# Patient Record
Sex: Female | Born: 1965
Health system: Southern US, Community
[De-identification: ages and names within clinical notes are randomized; demographics above are authoritative.]

## PROBLEM LIST (undated history)

## (undated) HISTORY — PX: BREAST BIOPSY: SHX20

---

## 1998-12-07 ENCOUNTER — Other Ambulatory Visit: Admission: RE | Admit: 1998-12-07 | Discharge: 1998-12-07 | Payer: Self-pay | Admitting: *Deleted

## 1999-12-17 ENCOUNTER — Other Ambulatory Visit: Admission: RE | Admit: 1999-12-17 | Discharge: 1999-12-17 | Payer: Self-pay | Admitting: *Deleted

## 2000-01-28 ENCOUNTER — Other Ambulatory Visit: Admission: RE | Admit: 2000-01-28 | Discharge: 2000-01-28 | Payer: Self-pay | Admitting: *Deleted

## 2006-03-12 ENCOUNTER — Ambulatory Visit: Payer: Self-pay | Admitting: Obstetrics and Gynecology

## 2007-05-27 ENCOUNTER — Ambulatory Visit: Payer: Self-pay | Admitting: Obstetrics and Gynecology

## 2008-07-17 ENCOUNTER — Ambulatory Visit: Payer: Self-pay | Admitting: Obstetrics and Gynecology

## 2008-07-27 ENCOUNTER — Ambulatory Visit: Payer: Self-pay | Admitting: Obstetrics and Gynecology

## 2009-09-19 ENCOUNTER — Ambulatory Visit: Payer: Self-pay | Admitting: Obstetrics and Gynecology

## 2009-10-02 ENCOUNTER — Ambulatory Visit: Payer: Self-pay

## 2010-12-26 ENCOUNTER — Ambulatory Visit: Payer: Self-pay | Admitting: Obstetrics and Gynecology

## 2012-02-25 ENCOUNTER — Ambulatory Visit: Payer: Self-pay | Admitting: Obstetrics and Gynecology

## 2013-03-02 ENCOUNTER — Ambulatory Visit: Payer: Self-pay | Admitting: Obstetrics and Gynecology

## 2014-03-29 ENCOUNTER — Ambulatory Visit: Payer: Self-pay | Admitting: Obstetrics and Gynecology

## 2015-06-01 ENCOUNTER — Other Ambulatory Visit
Admission: RE | Admit: 2015-06-01 | Discharge: 2015-06-01 | Disposition: A | Discharge status: Home / Self Care | Payer: 59 | Source: Ambulatory Visit | Attending: Obstetrics and Gynecology | Admitting: Obstetrics and Gynecology

## 2015-06-01 DIAGNOSIS — Z1329 Encounter for screening for other suspected endocrine disorder: Secondary | ICD-10-CM | POA: Insufficient documentation

## 2015-06-01 DIAGNOSIS — Z131 Encounter for screening for diabetes mellitus: Secondary | ICD-10-CM | POA: Insufficient documentation

## 2015-06-01 DIAGNOSIS — Z1321 Encounter for screening for nutritional disorder: Secondary | ICD-10-CM | POA: Diagnosis not present

## 2015-06-01 DIAGNOSIS — Z136 Encounter for screening for cardiovascular disorders: Secondary | ICD-10-CM | POA: Diagnosis present

## 2015-06-01 DIAGNOSIS — Z13220 Encounter for screening for lipoid disorders: Secondary | ICD-10-CM | POA: Diagnosis not present

## 2015-06-01 LAB — LIPID PANEL
Cholesterol: 253 mg/dL — ABNORMAL HIGH (ref 0–200)
HDL: 55 mg/dL (ref >40)
LDL Cholesterol: 175 mg/dL — ABNORMAL HIGH (ref 0–99)
Total CHOL/HDL Ratio: 4.6 RATIO
Triglycerides: 116 mg/dL (ref <150)
VLDL: 23 mg/dL (ref 0–40)

## 2015-06-01 LAB — COMPREHENSIVE METABOLIC PANEL
ALBUMIN: 3.9 g/dL (ref 3.5–5.0)
ALK PHOS: 52 U/L (ref 38–126)
ALT: 9 U/L — AB (ref 14–54)
AST: 24 U/L (ref 15–41)
Anion gap: 7 (ref 5–15)
BUN: 16 mg/dL (ref 6–20)
CALCIUM: 8.9 mg/dL (ref 8.9–10.3)
CHLORIDE: 109 mmol/L (ref 101–111)
CO2: 25 mmol/L (ref 22–32)
Creatinine, Ser: 0.87 mg/dL (ref 0.44–1.00)
GFR calc Af Amer: >60 mL/min (ref >60)
GFR calc non Af Amer: >60 mL/min (ref >60)
GLUCOSE: 99 mg/dL (ref 65–99)
Potassium: 3.6 mmol/L (ref 3.5–5.1)
SODIUM: 141 mmol/L (ref 135–145)
Total Bilirubin: 0.4 mg/dL (ref 0.3–1.2)
Total Protein: 7.7 g/dL (ref 6.5–8.1)

## 2015-06-01 LAB — CBC WITH DIFFERENTIAL/PLATELET
Basophils Absolute: 0 10*3/uL (ref 0–0.1)
Basophils Relative: 0 %
EOS ABS: 0.1 10*3/uL (ref 0–0.7)
EOS PCT: 2 %
HCT: 39 % (ref 35.0–47.0)
HEMOGLOBIN: 12.3 g/dL (ref 12.0–16.0)
Lymphocytes Relative: 36 %
Lymphs Abs: 1.6 10*3/uL (ref 1.0–3.6)
MCH: 24.4 pg — ABNORMAL LOW (ref 26.0–34.0)
MCHC: 31.6 g/dL — ABNORMAL LOW (ref 32.0–36.0)
MCV: 77 fL — ABNORMAL LOW (ref 80.0–100.0)
Monocytes Absolute: 0.4 10*3/uL (ref 0.2–0.9)
Monocytes Relative: 8 %
NEUTROS PCT: 54 %
Neutro Abs: 2.4 10*3/uL (ref 1.4–6.5)
PLATELETS: 183 10*3/uL (ref 150–440)
RBC: 5.07 MIL/uL (ref 3.80–5.20)
RDW: 19.2 % — ABNORMAL HIGH (ref 11.5–14.5)
WBC: 4.5 10*3/uL (ref 3.6–11.0)

## 2015-06-01 LAB — TSH: TSH: 1.64 u[IU]/mL (ref 0.350–4.500)

## 2015-06-02 LAB — VITAMIN D 25 HYDROXY (VIT D DEFICIENCY, FRACTURES): Vit D, 25-Hydroxy: 18.1 ng/mL — ABNORMAL LOW (ref 30.0–100.0)

## 2015-06-12 ENCOUNTER — Other Ambulatory Visit: Payer: Self-pay | Admitting: Obstetrics and Gynecology

## 2015-06-12 DIAGNOSIS — Z1231 Encounter for screening mammogram for malignant neoplasm of breast: Secondary | ICD-10-CM

## 2015-06-13 ENCOUNTER — Ambulatory Visit
Admission: RE | Admit: 2015-06-13 | Discharge: 2015-06-13 | Disposition: A | Discharge status: Home / Self Care | Payer: 59 | Source: Ambulatory Visit | Attending: Obstetrics and Gynecology | Admitting: Obstetrics and Gynecology

## 2015-06-13 DIAGNOSIS — Z1231 Encounter for screening mammogram for malignant neoplasm of breast: Secondary | ICD-10-CM

## 2015-11-27 DIAGNOSIS — D485 Neoplasm of uncertain behavior of skin: Secondary | ICD-10-CM | POA: Diagnosis not present

## 2015-12-31 ENCOUNTER — Encounter: Payer: Self-pay | Admitting: Physician Assistant

## 2015-12-31 ENCOUNTER — Ambulatory Visit: Payer: Self-pay | Admitting: Physician Assistant

## 2015-12-31 VITALS — BP 112/80 | HR 76 | Temp 98.3°F

## 2015-12-31 DIAGNOSIS — J018 Other acute sinusitis: Secondary | ICD-10-CM

## 2015-12-31 MED ORDER — AZITHROMYCIN 250 MG PO TABS: ORAL_TABLET | ORAL | Status: DC

## 2015-12-31 NOTE — Progress Notes (Signed)
S: C/o runny nose and congestion for 3 days, no fever, chills, cp/sob, v/d; mucus is yellow and thick, cough is sporadic, c/o of facial and dental pain. Some headache, thinks she might have had a fever, husband said she felt warm  Using otc meds:   O: PE: vitals wnl, nad, perrl eomi, normocephalic, tms dull, nasal mucosa red and swollen, throat injected, neck supple no lymph, lungs c t a, cv rrr, neuro intact  A:  Acute sinusitis   P: zpack, drink fluids, continue regular meds , use otc meds of choice, return if not improving in 5 days, return earlier if worsening

## 2016-01-21 ENCOUNTER — Telehealth: Payer: Self-pay | Admitting: Gastroenterology

## 2016-01-21 NOTE — Telephone Encounter (Signed)
colonoscopy

## 2016-01-22 DIAGNOSIS — D2311 Other benign neoplasm of skin of right eyelid, including canthus: Secondary | ICD-10-CM | POA: Diagnosis not present

## 2016-01-22 DIAGNOSIS — D485 Neoplasm of uncertain behavior of skin: Secondary | ICD-10-CM | POA: Diagnosis not present

## 2016-02-15 ENCOUNTER — Other Ambulatory Visit: Payer: Self-pay

## 2016-02-15 ENCOUNTER — Telehealth: Payer: Self-pay

## 2016-02-15 NOTE — Telephone Encounter (Signed)
LVM for pt to return my call.

## 2016-02-15 NOTE — Telephone Encounter (Signed)
Gastroenterology Pre-Procedure Review  Request Date: 02/29/16 Requesting Physician: Dr. Georga Bora  PATIENT REVIEW QUESTIONS: The patient responded to the following health history questions as indicated:    1. Are you having any GI issues? no 2. Do you have a personal history of Polyps? no 3. Do you have a family history of Colon Cancer or Polyps? no 4. Diabetes Mellitus? no 5. Joint replacements in the past 12 months?no 6. Major health problems in the past 3 months?no 7. Any artificial heart valves, MVP, or defibrillator?no    MEDICATIONS & ALLERGIES:    Patient reports the following regarding taking any anticoagulation/antiplatelet therapy:   Plavix, Coumadin, Eliquis, Xarelto, Lovenox, Pradaxa, Brilinta, or Effient? no Aspirin? no  Patient confirms/reports the following medications:  Current Outpatient Prescriptions  Medication Sig Dispense Refill  . azithromycin (ZITHROMAX) 250 MG tablet 2 pills today then 1 pill a day for 4 days 6 tablet 0   No current facility-administered medications for this visit.    Patient confirms/reports the following allergies:  Not on File  No orders of the defined types were placed in this encounter.    AUTHORIZATION INFORMATION Primary Insurance: 1D#: Group #:  Secondary Insurance: 1D#: Group #:  SCHEDULE INFORMATION: Date: 02/29/16 Time: Location: [place]

## 2016-02-15 NOTE — Telephone Encounter (Signed)
Pt scheduled for screening colonoscopy at [facility] on 02/29/16. Instructs/rx mailed. Please precert.

## 2016-02-27 NOTE — Discharge Instructions (Signed)

## 2016-02-29 ENCOUNTER — Encounter
Admission: RE | Disposition: A | Discharge status: Home / Self Care | Payer: Self-pay | Source: Ambulatory Visit | Attending: Gastroenterology

## 2016-02-29 ENCOUNTER — Ambulatory Visit: Payer: 59 | Admitting: Anesthesiology

## 2016-02-29 ENCOUNTER — Ambulatory Visit
Admission: RE | Admit: 2016-02-29 | Discharge: 2016-02-29 | Disposition: A | Discharge status: Home / Self Care | Payer: 59 | Source: Ambulatory Visit | Attending: Gastroenterology | Admitting: Gastroenterology

## 2016-02-29 DIAGNOSIS — Z0000 Encounter for general adult medical examination without abnormal findings: Secondary | ICD-10-CM | POA: Insufficient documentation

## 2016-02-29 DIAGNOSIS — Z9889 Other specified postprocedural states: Secondary | ICD-10-CM | POA: Insufficient documentation

## 2016-02-29 DIAGNOSIS — Z1211 Encounter for screening for malignant neoplasm of colon: Secondary | ICD-10-CM | POA: Insufficient documentation

## 2016-02-29 DIAGNOSIS — K640 First degree hemorrhoids: Secondary | ICD-10-CM | POA: Diagnosis not present

## 2016-02-29 HISTORY — PX: COLONOSCOPY WITH PROPOFOL: SHX5780

## 2016-02-29 SURGERY — COLONOSCOPY WITH PROPOFOL
Anesthesia: Monitor Anesthesia Care | Wound class: Contaminated

## 2016-02-29 MED ORDER — STERILE WATER FOR IRRIGATION IR SOLN
Status: DC | PRN
  Administered 2016-02-29: 10:00:00

## 2016-02-29 MED ORDER — LACTATED RINGERS IV SOLN
INTRAVENOUS | Status: DC
  Administered 2016-02-29: 09:00:00 via INTRAVENOUS

## 2016-02-29 MED ORDER — PROPOFOL 10 MG/ML IV BOLUS
INTRAVENOUS | Status: DC | PRN
Start: 2016-02-29 — End: 2016-02-29
  Administered 2016-02-29 (×3): 20 mg via INTRAVENOUS
  Administered 2016-02-29: 100 mg via INTRAVENOUS
  Administered 2016-02-29: 20 mg via INTRAVENOUS
  Administered 2016-02-29: 50 mg via INTRAVENOUS
  Administered 2016-02-29: 20 mg via INTRAVENOUS
  Administered 2016-02-29: 50 mg via INTRAVENOUS

## 2016-02-29 MED ORDER — LIDOCAINE HCL (CARDIAC) 20 MG/ML IV SOLN
INTRAVENOUS | Status: DC | PRN
  Administered 2016-02-29: 40 mg via INTRAVENOUS

## 2016-02-29 SURGICAL SUPPLY — 22 items
CANISTER SUCT 1200ML W/VALVE (MISCELLANEOUS) ×3 IMPLANT
CLIP HMST 235XBRD CATH ROT (MISCELLANEOUS) IMPLANT
CLIP RESOLUTION 360 11X235 (MISCELLANEOUS)
FCP ESCP3.2XJMB 240X2.8X (MISCELLANEOUS)
FORCEPS BIOP RAD 4 LRG CAP 4 (CUTTING FORCEPS) IMPLANT
FORCEPS BIOP RJ4 240 W/NDL (MISCELLANEOUS)
FORCEPS ESCP3.2XJMB 240X2.8X (MISCELLANEOUS) IMPLANT
GOWN CVR UNV OPN BCK APRN NK (MISCELLANEOUS) ×2 IMPLANT
GOWN ISOL THUMB LOOP REG UNIV (MISCELLANEOUS) ×4
INJECTOR VARIJECT VIN23 (MISCELLANEOUS) IMPLANT
KIT DEFENDO VALVE AND CONN (KITS) IMPLANT
KIT ENDO PROCEDURE OLY (KITS) ×3 IMPLANT
MARKER SPOT ENDO TATTOO 5ML (MISCELLANEOUS) IMPLANT
PAD GROUND ADULT SPLIT (MISCELLANEOUS) IMPLANT
PROBE APC STR FIRE (PROBE) IMPLANT
SNARE SHORT THROW 13M SML OVAL (MISCELLANEOUS) IMPLANT
SNARE SHORT THROW 30M LRG OVAL (MISCELLANEOUS) IMPLANT
SNARE SNG USE RND 15MM (INSTRUMENTS) IMPLANT
SPOT EX ENDOSCOPIC TATTOO (MISCELLANEOUS)
TRAP ETRAP POLY (MISCELLANEOUS) IMPLANT
VARIJECT INJECTOR VIN23 (MISCELLANEOUS)
WATER STERILE IRR 250ML POUR (IV SOLUTION) ×3 IMPLANT

## 2016-02-29 NOTE — H&P (Signed)
  [facility]  [address] [place], [place] [address] Phone: [phone] Fax : [phone]  Primary Care Physician:  Pcp Not In System Primary Gastroenterologist:  Dr. Allen Norris  Pre-Procedure History & Physical: HPI:  Michele Hood is a 50 y.o. female is here for a screening colonoscopy.   History reviewed. No pertinent past medical history.  Past Surgical History  Procedure Laterality Date  . Breast biopsy Left 1990's    cyst removed    Prior to Admission medications   Medication Sig Start Date End Date Taking? Authorizing Provider  azithromycin (ZITHROMAX) 250 MG tablet 2 pills today then 1 pill a day for 4 days Patient not taking: Reported on 02/19/2016 12/31/15   Versie Starks, PA-C    Allergies as of 02/15/2016  . (No Known Allergies)    History reviewed. No pertinent family history.  Social History   Social History  . Marital Status: Married    Spouse Name: N/A  . Number of Children: N/A  . Years of Education: N/A   Occupational History  . Not on file.   Social History Main Topics  . Smoking status: Never Smoker   . Smokeless tobacco: Not on file  . Alcohol Use: No  . Drug Use: Not on file  . Sexual Activity: Not on file   Other Topics Concern  . Not on file   Social History Narrative    Review of Systems: See HPI, otherwise negative ROS  Physical Exam: BP 138/91 mmHg  Pulse 82  Temp(Src) 98.1 F (36.7 C) (Temporal)  Ht 5' 3.5" (1.613 m)  Wt 215 lb (97.523 kg)  BMI 37.48 kg/m2  SpO2 100%  LMP  General:   Alert,  pleasant and cooperative in NAD Head:  Normocephalic and atraumatic. Neck:  Supple; no masses or thyromegaly. Lungs:  Clear throughout to auscultation.    Heart:  Regular rate and rhythm. Abdomen:  Soft, nontender and nondistended. Normal bowel sounds, without guarding, and without rebound.   Neurologic:  Alert and  oriented x4;  grossly normal neurologically.  Impression/Plan: Michele Hood is now  here to undergo a screening colonoscopy.  Risks, benefits, and alternatives regarding colonoscopy have been reviewed with the patient.  Questions have been answered.  All parties agreeable.

## 2016-02-29 NOTE — Anesthesia Preprocedure Evaluation (Signed)
Anesthesia Evaluation  Patient identified by MRN, date of birth, ID band Patient awake    Airway Mallampati: I  TM Distance: >3 FB Neck ROM: Full    Dental   Pulmonary    Pulmonary exam normal        Cardiovascular Normal cardiovascular exam     Neuro/Psych    GI/Hepatic   Endo/Other    Renal/GU      Musculoskeletal   Abdominal   Peds  Hematology   Anesthesia Other Findings   Reproductive/Obstetrics                             Anesthesia Physical Anesthesia Plan  ASA: II  Anesthesia Plan: MAC   Post-op Pain Management:    Induction: Intravenous  Airway Management Planned:   Additional Equipment:   Intra-op Plan:   Post-operative Plan:   Informed Consent: I have reviewed the patients History and Physical, chart, labs and discussed the procedure including the risks, benefits and alternatives for the proposed anesthesia with the patient or authorized representative who has indicated his/her understanding and acceptance.     Plan Discussed with: CRNA  Anesthesia Plan Comments:         Anesthesia Quick Evaluation

## 2016-02-29 NOTE — Op Note (Signed)
[facility]astroenterology Patient Name: Michele Hood Procedure Date: 02/29/2016 10:00 AM MRN: [id number] Account #: [id number] Date of Birth: September 30, 1965 Admit Type: Outpatient Age: 50 Room: [facility] OR ROOM 01 Gender: Female Note Status: Finalized Procedure:            Colonoscopy Indications:          Screening for colorectal malignant neoplasm Providers:            Lucilla Lame, MD Referring MD:         Shelby Mattocks. Georga Bora, MD (Referring MD) Medicines:            Propofol per Anesthesia Complications:        No immediate complications. Procedure:            Pre-Anesthesia Assessment:                       - Prior to the procedure, a History and Physical was                        performed, and patient medications and allergies were                        reviewed. The patient's tolerance of previous                        anesthesia was also reviewed. The risks and benefits of                        the procedure and the sedation options and risks were                        discussed with the patient. All questions were                        answered, and informed consent was obtained. Prior                        Anticoagulants: The patient has taken no previous                        anticoagulant or antiplatelet agents. ASA Grade                        Assessment: II - A patient with mild systemic disease.                        After reviewing the risks and benefits, the patient was                        deemed in satisfactory condition to undergo the                        procedure.                       After obtaining informed consent, the colonoscope was                        passed under direct vision. Throughout the procedure,  the patient's blood pressure, pulse, and oxygen                        saturations were monitored continuously. The Olympus CF                        H180AL colonoscope (S#: [id number]) was introduced  through                        the anus and advanced to the the cecum, identified by                        appendiceal orifice and ileocecal valve. The                        colonoscopy was performed without difficulty. The                        patient tolerated the procedure well. The quality of                        the bowel preparation was excellent. Findings:      The perianal and digital rectal examinations were normal.      Non-bleeding internal hemorrhoids were found during retroflexion. The       hemorrhoids were Grade I (internal hemorrhoids that do not prolapse). Impression:           - Non-bleeding internal hemorrhoids.                       - No specimens collected. Recommendation:       - Repeat colonoscopy in 10 years for screening unless                        any change in family history or lower GI problems. Procedure Code(s):    --- Professional ---                       [phone], Colonoscopy, flexible; diagnostic, including                        collection of specimen(s) by brushing or washing, when                        performed (separate procedure) Diagnosis Code(s):    --- Professional ---                       Z12.11, Encounter for screening for malignant neoplasm                        of colon CPT copyright 2016 American Medical Association. All rights reserved. The codes documented in this report are preliminary and upon coder review may  be revised to meet current compliance requirements. Lucilla Lame, MD 02/29/2016 10:22:04 AM This report has been signed electronically. Number of Addenda: 0 Note Initiated On: 02/29/2016 10:00 AM Scope Withdrawal Time: 0 hours 6 minutes 45 seconds  Total Procedure Duration: 0 hours 11 minutes 39 seconds       [facility]

## 2016-02-29 NOTE — Transfer of Care (Signed)
Immediate Anesthesia Transfer of Care Note  Patient: Michele Hood  Procedure(s) Performed: Procedure(s): COLONOSCOPY WITH PROPOFOL (N/A)  Patient Location: PACU  Anesthesia Type: MAC  Level of Consciousness: awake, alert  and patient cooperative  Airway and Oxygen Therapy: Patient Spontanous Breathing and Patient connected to supplemental oxygen  Post-op Assessment: Post-op Vital signs reviewed, Patient's Cardiovascular Status Stable, Respiratory Function Stable, Patent Airway and No signs of Nausea or vomiting  Post-op Vital Signs: Reviewed and stable  Complications: No apparent anesthesia complications

## 2016-02-29 NOTE — Anesthesia Postprocedure Evaluation (Signed)
Anesthesia Post Note  Patient: Michele Hood  Procedure(s) Performed: Procedure(s) (LRB): COLONOSCOPY WITH PROPOFOL (N/A)  Patient location during evaluation: PACU Anesthesia Type: General Level of consciousness: awake and alert Pain management: pain level controlled Vital Signs Assessment: post-procedure vital signs reviewed and stable Respiratory status: spontaneous breathing, nonlabored ventilation, respiratory function stable and patient connected to nasal cannula oxygen Cardiovascular status: blood pressure returned to baseline and stable Postop Assessment: no signs of nausea or vomiting Anesthetic complications: no    Marshell Levan

## 2016-02-29 NOTE — Anesthesia Procedure Notes (Signed)
Procedure Name: MAC Performed by: Juwon Scripter Pre-anesthesia Checklist: Patient identified, Emergency Drugs available, Suction available, Patient being monitored and Timeout performed Patient Re-evaluated:Patient Re-evaluated prior to inductionOxygen Delivery Method: Nasal cannula       

## 2016-03-03 ENCOUNTER — Encounter: Payer: Self-pay | Admitting: Gastroenterology

## 2016-05-13 ENCOUNTER — Other Ambulatory Visit: Payer: Self-pay | Admitting: Physician Assistant

## 2016-05-13 ENCOUNTER — Ambulatory Visit
Admission: RE | Admit: 2016-05-13 | Discharge: 2016-05-13 | Disposition: A | Discharge status: Home / Self Care | Payer: 59 | Source: Ambulatory Visit | Attending: Physician Assistant | Admitting: Physician Assistant

## 2016-05-13 ENCOUNTER — Ambulatory Visit: Payer: Self-pay | Admitting: Physician Assistant

## 2016-05-13 ENCOUNTER — Encounter: Payer: Self-pay | Admitting: Physician Assistant

## 2016-05-13 DIAGNOSIS — M79604 Pain in right leg: Secondary | ICD-10-CM

## 2016-05-13 NOTE — Progress Notes (Addendum)
Spoke with Megan @ Ultrasound 1610 P.M  Negative for DVT patient was released. Patient was notified of result

## 2016-05-13 NOTE — Progress Notes (Signed)
Contacted [employer] scheduling spoke with [employer].. Appt scheduled for today(NOW) @ [place]. Patient was notified.

## 2016-05-13 NOTE — Progress Notes (Signed)
S: c/o r lower leg pain, 1 week ago drove to [place], was longer than 8 hours each way, then this weekend drove her child back to school and the drive was longer than 6 hours, didn't stop a lot, tried to just make it without stopping, now has pain in r lower leg, ?if pulled muscle or dvt, area of pain is located in calf, denies cp/sob  O: nad, r lower leg tender in calf muscle, no redness or swelling, n/v intact  A: acute lower leg pain  P: due to risk factors, ordered US lower leg stat

## 2016-07-23 ENCOUNTER — Other Ambulatory Visit: Payer: Self-pay | Admitting: Obstetrics and Gynecology

## 2016-07-23 DIAGNOSIS — Z1231 Encounter for screening mammogram for malignant neoplasm of breast: Secondary | ICD-10-CM

## 2016-08-06 ENCOUNTER — Ambulatory Visit
Admission: RE | Admit: 2016-08-06 | Discharge: 2016-08-06 | Disposition: A | Discharge status: Home / Self Care | Payer: 59 | Source: Ambulatory Visit | Attending: Obstetrics and Gynecology | Admitting: Obstetrics and Gynecology

## 2016-08-06 ENCOUNTER — Encounter: Payer: Self-pay | Admitting: Radiology

## 2016-08-06 DIAGNOSIS — Z1231 Encounter for screening mammogram for malignant neoplasm of breast: Secondary | ICD-10-CM | POA: Insufficient documentation

## 2016-08-07 DIAGNOSIS — Z124 Encounter for screening for malignant neoplasm of cervix: Secondary | ICD-10-CM | POA: Diagnosis not present

## 2016-08-07 DIAGNOSIS — E282 Polycystic ovarian syndrome: Secondary | ICD-10-CM | POA: Diagnosis not present

## 2016-08-07 DIAGNOSIS — Z01419 Encounter for gynecological examination (general) (routine) without abnormal findings: Secondary | ICD-10-CM | POA: Diagnosis not present

## 2016-10-22 DIAGNOSIS — H524 Presbyopia: Secondary | ICD-10-CM | POA: Diagnosis not present

## 2017-08-26 ENCOUNTER — Other Ambulatory Visit: Payer: Self-pay | Admitting: Obstetrics and Gynecology

## 2017-08-26 DIAGNOSIS — Z1231 Encounter for screening mammogram for malignant neoplasm of breast: Secondary | ICD-10-CM

## 2017-09-16 ENCOUNTER — Ambulatory Visit
Admission: RE | Admit: 2017-09-16 | Discharge: 2017-09-16 | Disposition: A | Discharge status: Home / Self Care | Payer: 59 | Source: Ambulatory Visit | Attending: Obstetrics and Gynecology | Admitting: Obstetrics and Gynecology

## 2017-09-16 ENCOUNTER — Encounter: Payer: Self-pay | Admitting: Radiology

## 2017-09-16 DIAGNOSIS — Z01419 Encounter for gynecological examination (general) (routine) without abnormal findings: Secondary | ICD-10-CM | POA: Diagnosis not present

## 2017-09-16 DIAGNOSIS — Z1231 Encounter for screening mammogram for malignant neoplasm of breast: Secondary | ICD-10-CM | POA: Insufficient documentation

## 2017-09-16 DIAGNOSIS — Z113 Encounter for screening for infections with a predominantly sexual mode of transmission: Secondary | ICD-10-CM | POA: Diagnosis not present

## 2017-09-16 DIAGNOSIS — Z124 Encounter for screening for malignant neoplasm of cervix: Secondary | ICD-10-CM | POA: Diagnosis not present

## 2017-09-17 ENCOUNTER — Other Ambulatory Visit: Payer: Self-pay

## 2017-09-17 ENCOUNTER — Other Ambulatory Visit: Payer: Self-pay | Admitting: Obstetrics and Gynecology

## 2017-09-17 DIAGNOSIS — Z13820 Encounter for screening for osteoporosis: Secondary | ICD-10-CM

## 2017-09-17 NOTE — Progress Notes (Signed)
Patient came in to have her blood pressure checked per orders from Dr. Georga Bora.

## 2018-05-26 DIAGNOSIS — H524 Presbyopia: Secondary | ICD-10-CM | POA: Diagnosis not present

## 2018-09-20 ENCOUNTER — Other Ambulatory Visit: Payer: Self-pay | Admitting: Obstetrics and Gynecology

## 2018-09-20 DIAGNOSIS — Z1231 Encounter for screening mammogram for malignant neoplasm of breast: Secondary | ICD-10-CM

## 2018-10-13 ENCOUNTER — Ambulatory Visit: Payer: Self-pay

## 2018-10-13 ENCOUNTER — Ambulatory Visit
Admission: RE | Admit: 2018-10-13 | Discharge: 2018-10-13 | Disposition: A | Discharge status: Home / Self Care | Payer: 59 | Source: Ambulatory Visit | Attending: Obstetrics and Gynecology | Admitting: Obstetrics and Gynecology

## 2018-10-13 DIAGNOSIS — Z1231 Encounter for screening mammogram for malignant neoplasm of breast: Secondary | ICD-10-CM | POA: Diagnosis not present

## 2018-11-24 DIAGNOSIS — Z124 Encounter for screening for malignant neoplasm of cervix: Secondary | ICD-10-CM | POA: Diagnosis not present

## 2018-11-24 DIAGNOSIS — Z13220 Encounter for screening for lipoid disorders: Secondary | ICD-10-CM | POA: Diagnosis not present

## 2018-11-24 DIAGNOSIS — Z131 Encounter for screening for diabetes mellitus: Secondary | ICD-10-CM | POA: Diagnosis not present

## 2018-11-24 DIAGNOSIS — Z1321 Encounter for screening for nutritional disorder: Secondary | ICD-10-CM | POA: Diagnosis not present

## 2018-11-24 DIAGNOSIS — Z1329 Encounter for screening for other suspected endocrine disorder: Secondary | ICD-10-CM | POA: Diagnosis not present

## 2018-11-24 DIAGNOSIS — Z136 Encounter for screening for cardiovascular disorders: Secondary | ICD-10-CM | POA: Diagnosis not present

## 2018-11-24 DIAGNOSIS — Z01419 Encounter for gynecological examination (general) (routine) without abnormal findings: Secondary | ICD-10-CM | POA: Diagnosis not present

## 2018-11-24 DIAGNOSIS — Z113 Encounter for screening for infections with a predominantly sexual mode of transmission: Secondary | ICD-10-CM | POA: Diagnosis not present

## 2018-11-30 DIAGNOSIS — Z113 Encounter for screening for infections with a predominantly sexual mode of transmission: Secondary | ICD-10-CM | POA: Diagnosis not present

## 2018-12-06 ENCOUNTER — Other Ambulatory Visit: Payer: Self-pay | Admitting: Obstetrics and Gynecology

## 2018-12-06 DIAGNOSIS — Z13820 Encounter for screening for osteoporosis: Secondary | ICD-10-CM

## 2019-01-04 DIAGNOSIS — R070 Pain in throat: Secondary | ICD-10-CM | POA: Diagnosis not present

## 2019-12-07 ENCOUNTER — Other Ambulatory Visit: Payer: Self-pay | Admitting: Obstetrics and Gynecology

## 2019-12-07 DIAGNOSIS — Z1231 Encounter for screening mammogram for malignant neoplasm of breast: Secondary | ICD-10-CM

## 2019-12-20 ENCOUNTER — Ambulatory Visit
Admission: RE | Admit: 2019-12-20 | Discharge: 2019-12-20 | Disposition: A | Discharge status: Home / Self Care | Payer: 59 | Source: Ambulatory Visit | Attending: Obstetrics and Gynecology | Admitting: Obstetrics and Gynecology

## 2019-12-20 ENCOUNTER — Other Ambulatory Visit: Payer: Self-pay

## 2019-12-20 DIAGNOSIS — Z1231 Encounter for screening mammogram for malignant neoplasm of breast: Secondary | ICD-10-CM | POA: Diagnosis not present

## 2019-12-29 DIAGNOSIS — Z01419 Encounter for gynecological examination (general) (routine) without abnormal findings: Secondary | ICD-10-CM | POA: Diagnosis not present

## 2020-01-12 DIAGNOSIS — Z13220 Encounter for screening for lipoid disorders: Secondary | ICD-10-CM | POA: Diagnosis not present

## 2020-01-12 DIAGNOSIS — Z1321 Encounter for screening for nutritional disorder: Secondary | ICD-10-CM | POA: Diagnosis not present

## 2020-01-12 DIAGNOSIS — Z131 Encounter for screening for diabetes mellitus: Secondary | ICD-10-CM | POA: Diagnosis not present

## 2020-01-12 DIAGNOSIS — Z1329 Encounter for screening for other suspected endocrine disorder: Secondary | ICD-10-CM | POA: Diagnosis not present

## 2020-01-12 DIAGNOSIS — Z136 Encounter for screening for cardiovascular disorders: Secondary | ICD-10-CM | POA: Diagnosis not present

## 2020-04-26 DIAGNOSIS — H524 Presbyopia: Secondary | ICD-10-CM | POA: Diagnosis not present

## 2020-08-08 ENCOUNTER — Other Ambulatory Visit: Payer: Self-pay | Admitting: Internal Medicine

## 2020-09-07 ENCOUNTER — Ambulatory Visit: Payer: 59

## 2020-09-18 ENCOUNTER — Other Ambulatory Visit: Payer: Self-pay | Admitting: Dermatology

## 2020-09-18 DIAGNOSIS — L818 Other specified disorders of pigmentation: Secondary | ICD-10-CM | POA: Diagnosis not present

## 2020-09-18 DIAGNOSIS — L821 Other seborrheic keratosis: Secondary | ICD-10-CM | POA: Diagnosis not present

## 2020-09-24 DIAGNOSIS — Z1152 Encounter for screening for COVID-19: Secondary | ICD-10-CM | POA: Diagnosis not present

## 2020-10-23 ENCOUNTER — Ambulatory Visit: Payer: 59

## 2020-11-28 ENCOUNTER — Other Ambulatory Visit: Payer: Self-pay | Admitting: Obstetrics and Gynecology

## 2020-11-28 DIAGNOSIS — Z1231 Encounter for screening mammogram for malignant neoplasm of breast: Secondary | ICD-10-CM

## 2020-12-26 ENCOUNTER — Ambulatory Visit
Admission: RE | Admit: 2020-12-26 | Discharge: 2020-12-26 | Disposition: A | Discharge status: Home / Self Care | Payer: 59 | Source: Ambulatory Visit | Attending: Obstetrics and Gynecology | Admitting: Obstetrics and Gynecology

## 2020-12-26 ENCOUNTER — Other Ambulatory Visit: Payer: Self-pay

## 2020-12-26 DIAGNOSIS — Z1231 Encounter for screening mammogram for malignant neoplasm of breast: Secondary | ICD-10-CM | POA: Diagnosis not present

## 2020-12-31 ENCOUNTER — Other Ambulatory Visit: Payer: Self-pay

## 2021-01-01 DIAGNOSIS — Z01419 Encounter for gynecological examination (general) (routine) without abnormal findings: Secondary | ICD-10-CM | POA: Diagnosis not present

## 2021-01-01 DIAGNOSIS — Z6835 Body mass index (BMI) 35.0-35.9, adult: Secondary | ICD-10-CM | POA: Diagnosis not present

## 2021-01-01 DIAGNOSIS — F4321 Adjustment disorder with depressed mood: Secondary | ICD-10-CM | POA: Diagnosis not present

## 2021-01-01 DIAGNOSIS — E669 Obesity, unspecified: Secondary | ICD-10-CM | POA: Diagnosis not present

## 2021-01-11 DIAGNOSIS — Z13228 Encounter for screening for other metabolic disorders: Secondary | ICD-10-CM | POA: Diagnosis not present

## 2021-01-11 DIAGNOSIS — Z131 Encounter for screening for diabetes mellitus: Secondary | ICD-10-CM | POA: Diagnosis not present

## 2021-01-11 DIAGNOSIS — Z13220 Encounter for screening for lipoid disorders: Secondary | ICD-10-CM | POA: Diagnosis not present

## 2021-01-11 DIAGNOSIS — E559 Vitamin D deficiency, unspecified: Secondary | ICD-10-CM | POA: Diagnosis not present

## 2021-01-16 ENCOUNTER — Other Ambulatory Visit: Payer: Self-pay

## 2021-01-16 MED ORDER — VITAMIN D (ERGOCALCIFEROL) 1.25 MG (50000 UNIT) PO CAPS
ORAL_CAPSULE | ORAL | 0 refills | Status: DC
  Filled 2021-01-16 – 2021-02-06 (×2): qty 12, 84d supply, fill #0
  Filled 2021-04-17: qty 9, 63d supply, fill #1

## 2021-01-29 ENCOUNTER — Other Ambulatory Visit: Payer: Self-pay

## 2021-02-06 ENCOUNTER — Other Ambulatory Visit: Payer: Self-pay

## 2021-04-17 ENCOUNTER — Other Ambulatory Visit: Payer: Self-pay

## 2021-04-23 ENCOUNTER — Other Ambulatory Visit: Payer: Self-pay

## 2021-04-23 ENCOUNTER — Ambulatory Visit: Payer: Self-pay | Attending: Internal Medicine

## 2021-04-23 DIAGNOSIS — Z23 Encounter for immunization: Secondary | ICD-10-CM

## 2021-04-23 MED ORDER — PFIZER-BIONT COVID-19 VAC-TRIS 30 MCG/0.3ML IM SUSP
INTRAMUSCULAR | 0 refills | Status: AC
  Filled 2021-04-23: qty 0.3, 1d supply, fill #0

## 2021-04-23 NOTE — Progress Notes (Signed)
   Covid-19 Vaccination Clinic  Name:  Michele Hood    MRN: [id number] DOB: [birth year]  04/23/2021  Michele Hood was observed post Covid-19 immunization for 15 minutes without incident. She was provided with Vaccine Information Sheet and instruction to access the V-Safe system.   Michele Hood was instructed to call 911 with any severe reactions post vaccine: Difficulty breathing  Swelling of face and throat  A fast heartbeat  A bad rash all over body  Dizziness and weakness   Immunizations Administered     Name Date Dose VIS Date Route   PFIZER Comrnaty(Gray TOP) Covid-19 Vaccine 04/23/2021 11:19 AM 0.3 mL 09/06/2020 Intramuscular   Manufacturer: [employer]   Lot: [id number]   [place]: [place]ia, PharmD, MBA Clinical Acute Care Pharmacist

## 2021-07-31 ENCOUNTER — Other Ambulatory Visit: Payer: Self-pay

## 2021-08-01 ENCOUNTER — Other Ambulatory Visit: Payer: Self-pay

## 2021-08-07 ENCOUNTER — Other Ambulatory Visit: Payer: Self-pay

## 2021-08-07 MED ORDER — VITAMIN D (ERGOCALCIFEROL) 1.25 MG (50000 UNIT) PO CAPS
ORAL_CAPSULE | ORAL | 2 refills | Status: AC
  Filled 2021-08-07: qty 13, 90d supply, fill #0
  Filled 2021-11-13: qty 13, 90d supply, fill #1
  Filled 2022-03-11: qty 13, 90d supply, fill #2

## 2021-11-12 ENCOUNTER — Other Ambulatory Visit: Payer: Self-pay

## 2021-11-12 DIAGNOSIS — L818 Other specified disorders of pigmentation: Secondary | ICD-10-CM | POA: Diagnosis not present

## 2021-11-12 DIAGNOSIS — L590 Erythema ab igne [dermatitis ab igne]: Secondary | ICD-10-CM | POA: Diagnosis not present

## 2021-11-12 MED ORDER — HYDROQUINONE 4 % EX CREA
TOPICAL_CREAM | CUTANEOUS | 0 refills | Status: AC
  Filled 2021-11-12: qty 28.35, 30d supply, fill #0

## 2021-11-13 ENCOUNTER — Other Ambulatory Visit: Payer: Self-pay

## 2021-12-03 DIAGNOSIS — H524 Presbyopia: Secondary | ICD-10-CM | POA: Diagnosis not present

## 2021-12-18 IMAGING — MG DIGITAL SCREENING BILAT W/ TOMO W/ CAD
8 of 14 series · 8 of 40 positions shown · non-contrast
Comparison: Previous exam(s).

ACR Breast Density Category a: The breast tissue is almost entirely
fatty.

CLINICAL DATA: Screening.

EXAM:
DIGITAL SCREENING BILATERAL MAMMOGRAM WITH TOMO AND CAD

[R MLO synth-2D]
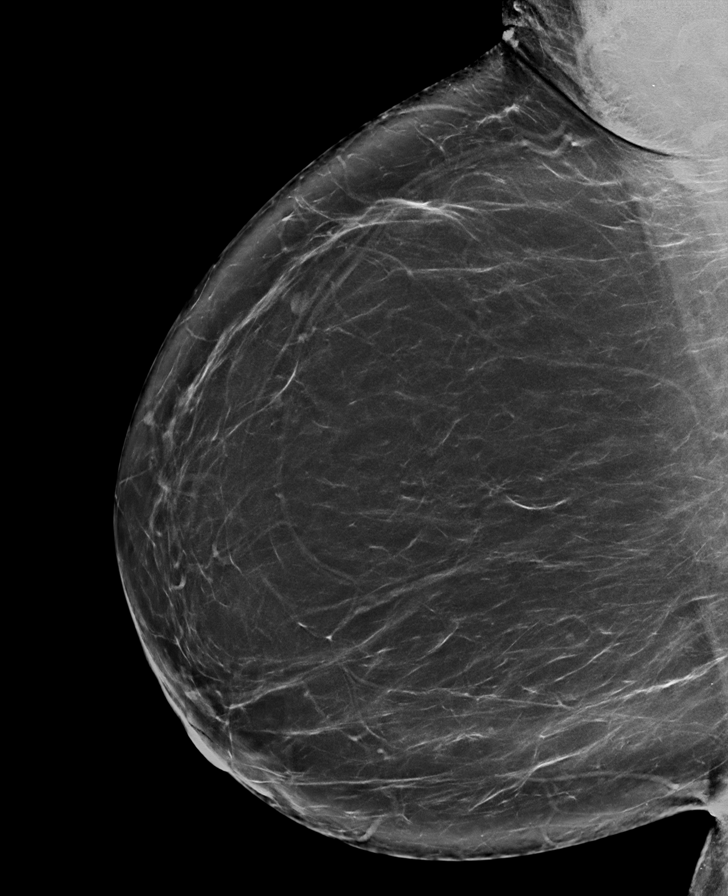

[L CC synth-2D (1 of 2)]
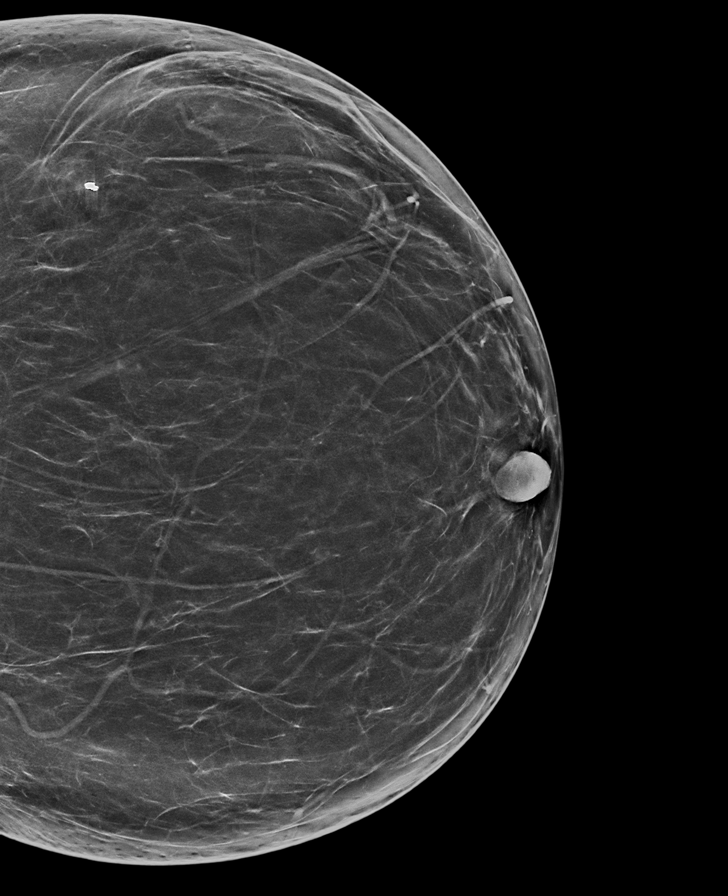

[L MLO synth-2D (1 of 2)]
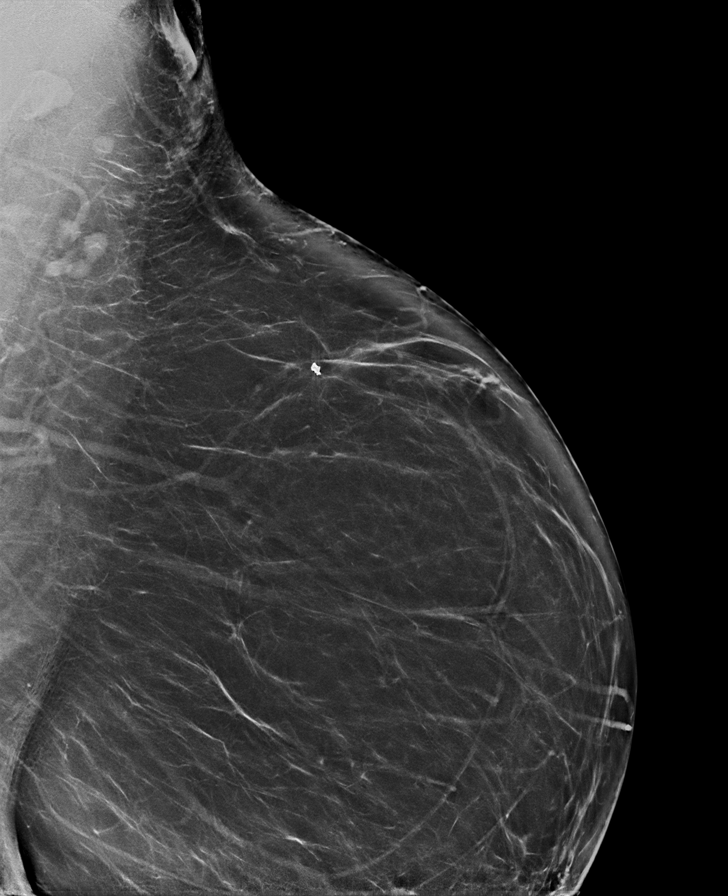

[R CC synth-2D (1 of 2)]
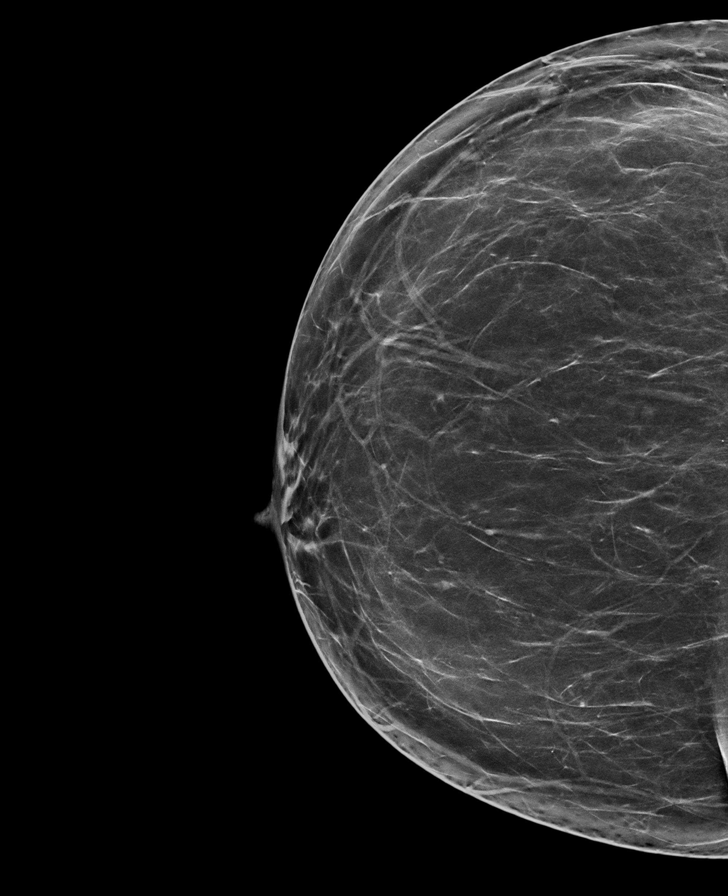

[L CC synth-2D (2 of 2)]
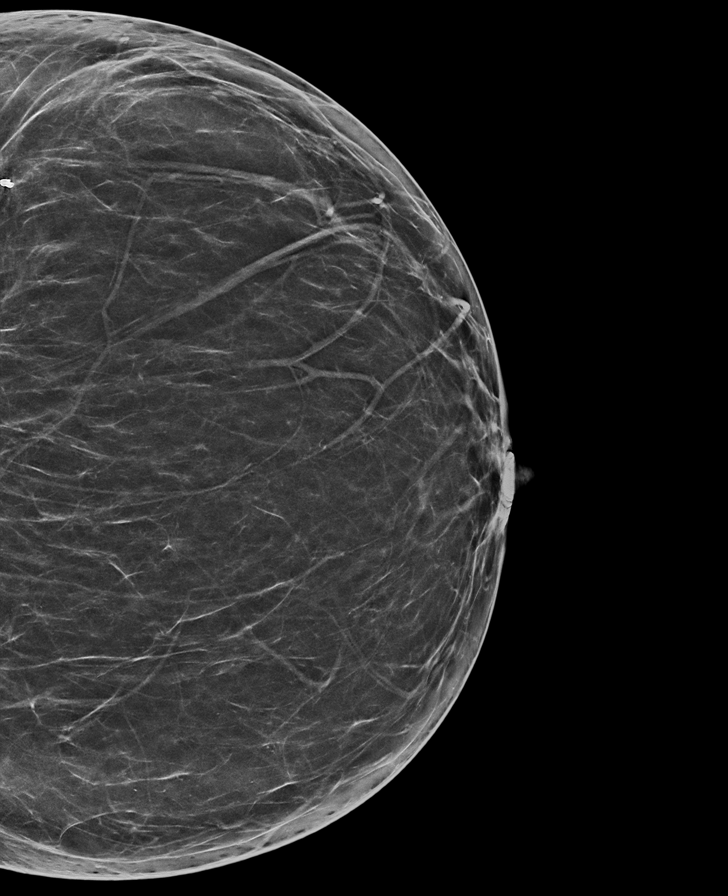

[L MLO synth-2D (2 of 2)]
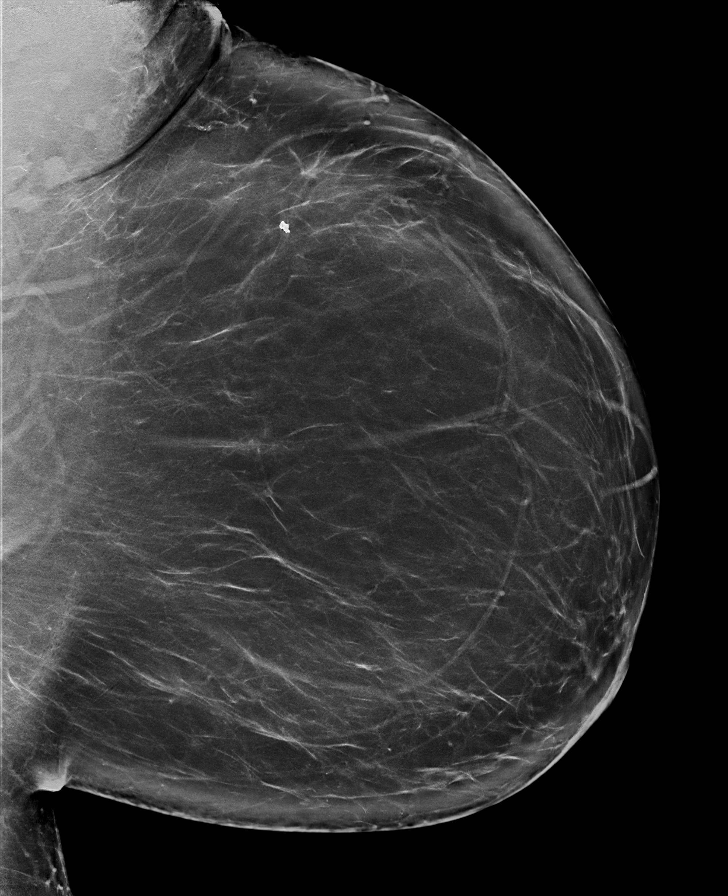

[R CC synth-2D (2 of 2)]
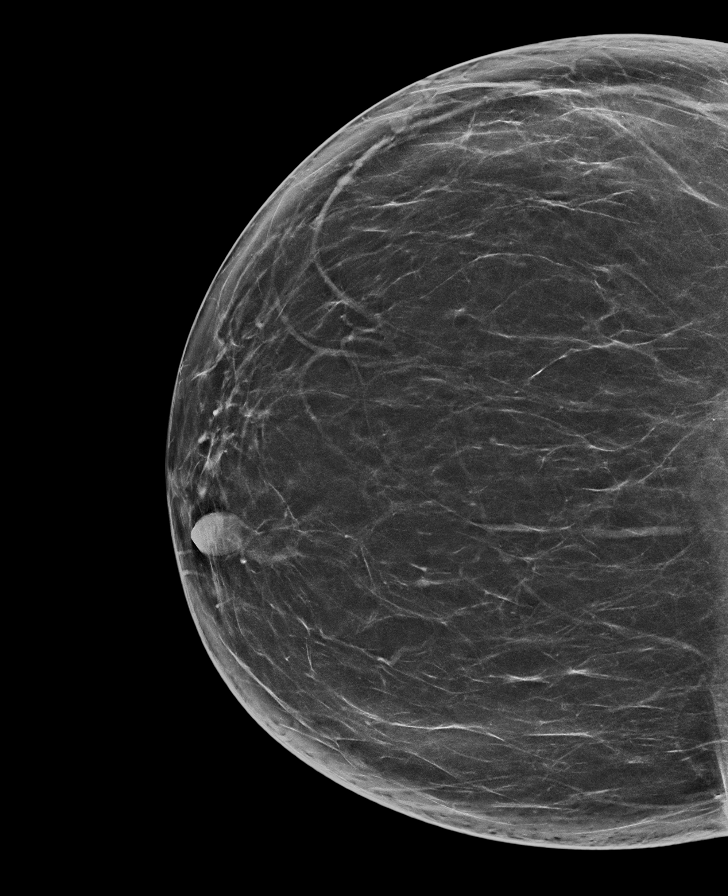

[L MLO tomo · tomo slice 52/103.0]
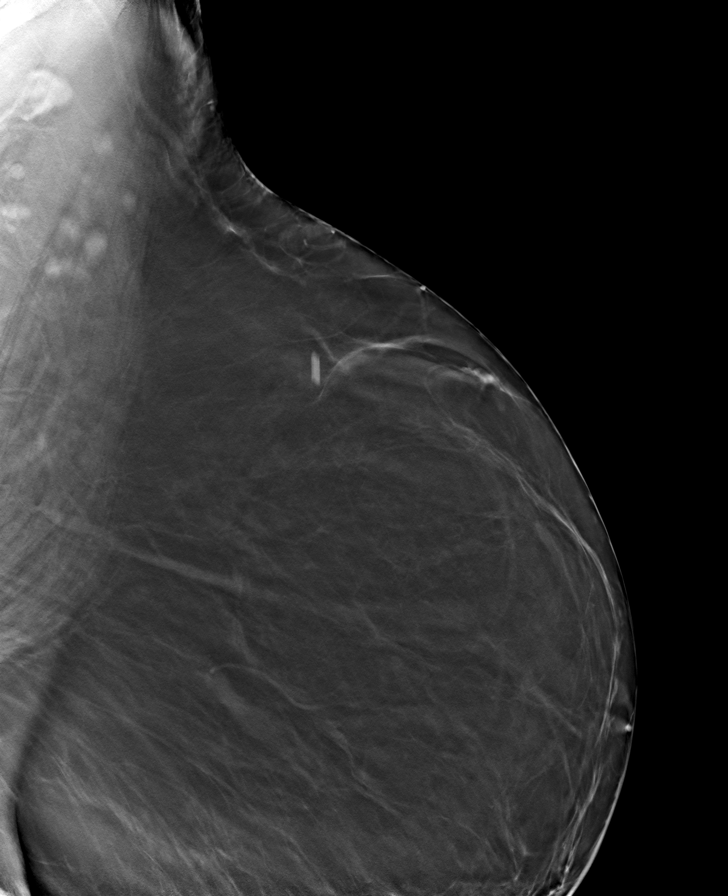

[8 of 40 positions shown; findings below may reference images not displayed]

FINDINGS: There are no findings suspicious for malignancy. Images were
processed with CAD.
IMPRESSION: No mammographic evidence of malignancy. A result letter of this
screening mammogram will be mailed directly to the patient.

RECOMMENDATION:
Screening mammogram in one year. (Code:[id number])

BI-RADS CATEGORY  1: Negative.

## 2021-12-20 ENCOUNTER — Other Ambulatory Visit: Payer: Self-pay | Admitting: Obstetrics and Gynecology

## 2021-12-20 ENCOUNTER — Other Ambulatory Visit: Payer: Self-pay

## 2021-12-20 DIAGNOSIS — Z1231 Encounter for screening mammogram for malignant neoplasm of breast: Secondary | ICD-10-CM

## 2022-01-30 ENCOUNTER — Ambulatory Visit: Payer: 59

## 2022-02-12 ENCOUNTER — Ambulatory Visit
Admission: RE | Admit: 2022-02-12 | Discharge: 2022-02-12 | Disposition: A | Discharge status: Home / Self Care | Payer: 59 | Source: Ambulatory Visit | Attending: Obstetrics and Gynecology | Admitting: Obstetrics and Gynecology

## 2022-02-12 DIAGNOSIS — Z1231 Encounter for screening mammogram for malignant neoplasm of breast: Secondary | ICD-10-CM | POA: Insufficient documentation

## 2022-03-11 ENCOUNTER — Other Ambulatory Visit: Payer: Self-pay

## 2022-03-12 ENCOUNTER — Other Ambulatory Visit: Payer: Self-pay

## 2022-04-23 ENCOUNTER — Other Ambulatory Visit: Payer: Self-pay | Admitting: Obstetrics and Gynecology

## 2022-04-23 DIAGNOSIS — Z01419 Encounter for gynecological examination (general) (routine) without abnormal findings: Secondary | ICD-10-CM | POA: Diagnosis not present

## 2022-04-23 DIAGNOSIS — Z1231 Encounter for screening mammogram for malignant neoplasm of breast: Secondary | ICD-10-CM

## 2022-04-24 ENCOUNTER — Other Ambulatory Visit: Payer: Self-pay

## 2022-04-24 ENCOUNTER — Telehealth: Payer: Self-pay

## 2022-04-24 DIAGNOSIS — Z1211 Encounter for screening for malignant neoplasm of colon: Secondary | ICD-10-CM

## 2022-04-24 MED ORDER — NA SULFATE-K SULFATE-MG SULF 17.5-3.13-1.6 GM/177ML PO SOLN
1.0000 | Freq: Once | ORAL | 0 refills | Status: AC
  Filled 2022-04-24: qty 354, fill #0
  Filled 2022-06-13: qty 354, 1d supply, fill #0

## 2022-04-24 NOTE — Telephone Encounter (Signed)
Colonoscopy Triage Completed and Colonoscopy has been scheduled.Her last colonoscopy was performed with Dr. Allen Norris 02/29/16. Informed pt she is not due to have her colonoscopy. No family history of colon cancer has been noted in 1st degree family member.   Dr. Allen Norris recommended repeat in 10 years. Due 02/28/2026.  Patient was informed of this and said that she will call her PCP to advise.    Gastroenterology Pre-Procedure Review  Request Date: 06/16/22 Requesting Physician: Dr. Allen Norris  PATIENT REVIEW QUESTIONS: The patient responded to the following health history questions as indicated:    1. Are you having any GI issues? no 2. Do you have a personal history of Polyps? no 3. Do you have a family history of Colon Cancer or Polyps? no 4. Diabetes Mellitus? no 5. Joint replacements in the past 12 months?no 6. Major health problems in the past 3 months?no 7. Any artificial heart valves, MVP, or defibrillator?no    MEDICATIONS & ALLERGIES:    Patient reports the following regarding taking any anticoagulation/antiplatelet therapy:   Plavix, Coumadin, Eliquis, Xarelto, Lovenox, Pradaxa, Brilinta, or Effient? no Aspirin? no  Patient confirms/reports the following medications:  Current Outpatient Medications  Medication Sig Dispense Refill   azithromycin (ZITHROMAX) 250 MG tablet 2 pills today then 1 pill a day for 4 days (Patient not taking: Reported on 02/19/2016) 6 tablet 0   COVID-19 mRNA Vac-TriS, Pfizer, (PFIZER-BIONT COVID-19 VAC-TRIS) SUSP injection Inject into the muscle. 0.3 mL 0   hydroquinone 4 % cream Apply a thin layer twice daily to affected areas for no more than 3 months 28.35 g 0   Vitamin D, Ergocalciferol, (DRISDOL) 1.25 MG (50000 UNIT) CAPS capsule Take 1 capsule by mouth once a week 21 capsule 2   No current facility-administered medications for this visit.    Patient confirms/reports the following allergies:  No Known Allergies  No orders of the defined types were placed  in this encounter.   AUTHORIZATION INFORMATION Primary Insurance: 1D#: Group #:  Secondary Insurance: 1D#: Group #:  SCHEDULE INFORMATION: Date: 06/16/22 Time: Location: [facility]

## 2022-04-29 DIAGNOSIS — Z1321 Encounter for screening for nutritional disorder: Secondary | ICD-10-CM | POA: Diagnosis not present

## 2022-04-29 DIAGNOSIS — Z13220 Encounter for screening for lipoid disorders: Secondary | ICD-10-CM | POA: Diagnosis not present

## 2022-04-29 DIAGNOSIS — Z13228 Encounter for screening for other metabolic disorders: Secondary | ICD-10-CM | POA: Diagnosis not present

## 2022-04-29 DIAGNOSIS — E559 Vitamin D deficiency, unspecified: Secondary | ICD-10-CM | POA: Diagnosis not present

## 2022-04-29 DIAGNOSIS — Z131 Encounter for screening for diabetes mellitus: Secondary | ICD-10-CM | POA: Diagnosis not present

## 2022-04-29 DIAGNOSIS — E782 Mixed hyperlipidemia: Secondary | ICD-10-CM | POA: Diagnosis not present

## 2022-04-29 DIAGNOSIS — Z1329 Encounter for screening for other suspected endocrine disorder: Secondary | ICD-10-CM | POA: Diagnosis not present

## 2022-04-29 DIAGNOSIS — Z8271 Family history of polycystic kidney: Secondary | ICD-10-CM | POA: Diagnosis not present

## 2022-05-07 ENCOUNTER — Other Ambulatory Visit: Payer: Self-pay | Admitting: Obstetrics and Gynecology

## 2022-05-07 DIAGNOSIS — Z801 Family history of malignant neoplasm of trachea, bronchus and lung: Secondary | ICD-10-CM

## 2022-05-21 ENCOUNTER — Other Ambulatory Visit (HOSPITAL_COMMUNITY): Payer: Self-pay

## 2022-06-12 ENCOUNTER — Encounter: Payer: Self-pay | Admitting: Gastroenterology

## 2022-06-13 ENCOUNTER — Other Ambulatory Visit: Payer: Self-pay

## 2022-06-16 ENCOUNTER — Ambulatory Visit (AMBULATORY_SURGERY_CENTER): Payer: 59 | Admitting: Anesthesiology

## 2022-06-16 ENCOUNTER — Encounter: Payer: Self-pay | Admitting: Gastroenterology

## 2022-06-16 ENCOUNTER — Ambulatory Visit: Payer: 59 | Admitting: Anesthesiology

## 2022-06-16 ENCOUNTER — Ambulatory Visit
Admission: RE | Admit: 2022-06-16 | Discharge: 2022-06-16 | Disposition: A | Discharge status: Home / Self Care | Payer: 59 | Attending: Gastroenterology | Admitting: Gastroenterology

## 2022-06-16 ENCOUNTER — Other Ambulatory Visit: Payer: Self-pay

## 2022-06-16 ENCOUNTER — Encounter
Admission: RE | Disposition: A | Discharge status: Home / Self Care | Payer: Self-pay | Source: Home / Self Care | Attending: Gastroenterology

## 2022-06-16 DIAGNOSIS — Z1211 Encounter for screening for malignant neoplasm of colon: Secondary | ICD-10-CM

## 2022-06-16 DIAGNOSIS — K640 First degree hemorrhoids: Secondary | ICD-10-CM | POA: Insufficient documentation

## 2022-06-16 HISTORY — PX: COLONOSCOPY WITH PROPOFOL: SHX5780

## 2022-06-16 SURGERY — COLONOSCOPY WITH PROPOFOL
Anesthesia: General

## 2022-06-16 MED ORDER — ONDANSETRON HCL 4 MG/2ML IJ SOLN: 4.0000 mg | Freq: Once | INTRAMUSCULAR | Status: DC | PRN

## 2022-06-16 MED ORDER — ACETAMINOPHEN 500 MG PO TABS: 1000.0000 mg | ORAL_TABLET | Freq: Once | ORAL | Status: DC | PRN

## 2022-06-16 MED ORDER — LACTATED RINGERS IV SOLN: INTRAVENOUS | Status: DC

## 2022-06-16 MED ORDER — STERILE WATER FOR IRRIGATION IR SOLN
Status: DC | PRN
  Administered 2022-06-16: 1000 mL

## 2022-06-16 MED ORDER — PROPOFOL 10 MG/ML IV BOLUS
INTRAVENOUS | Status: DC | PRN
  Administered 2022-06-16 (×6): 50 mg via INTRAVENOUS

## 2022-06-16 MED ORDER — SODIUM CHLORIDE 0.9 % IV SOLN: INTRAVENOUS | Status: DC

## 2022-06-16 MED ORDER — LIDOCAINE HCL (CARDIAC) PF 100 MG/5ML IV SOSY
PREFILLED_SYRINGE | INTRAVENOUS | Status: DC | PRN
  Administered 2022-06-16: 100 mg via INTRAVENOUS

## 2022-06-16 MED ORDER — ACETAMINOPHEN 160 MG/5ML PO SOLN: 975.0000 mg | Freq: Once | ORAL | Status: DC | PRN

## 2022-06-16 SURGICAL SUPPLY — 21 items

## 2022-06-16 NOTE — Anesthesia Preprocedure Evaluation (Signed)
Anesthesia Evaluation  Patient identified by MRN, date of birth, ID band Patient awake    Reviewed: Allergy & Precautions, H&P , NPO status , Patient's Chart, lab work & pertinent test results, reviewed documented beta blocker date and time   Airway Mallampati: II  TM Distance: >3 FB Neck ROM: full    Dental no notable dental hx.    Pulmonary neg pulmonary ROS,    Pulmonary exam normal breath sounds clear to auscultation       Cardiovascular Exercise Tolerance: Good negative cardio ROS   Rhythm:regular Rate:Normal     Neuro/Psych negative neurological ROS  negative psych ROS   GI/Hepatic negative GI ROS, Neg liver ROS,   Endo/Other  negative endocrine ROS  Renal/GU negative Renal ROS  negative genitourinary   Musculoskeletal   Abdominal   Peds  Hematology negative hematology ROS (+)   Anesthesia Other Findings BMI 37  Reproductive/Obstetrics negative OB ROS                             Anesthesia Physical Anesthesia Plan  ASA: 2  Anesthesia Plan: General   Post-op Pain Management:    Induction:   PONV Risk Score and Plan: 3 and TIVA, Propofol infusion and Treatment may vary due to age or medical condition  Airway Management Planned:   Additional Equipment:   Intra-op Plan:   Post-operative Plan:   Informed Consent: I have reviewed the patients History and Physical, chart, labs and discussed the procedure including the risks, benefits and alternatives for the proposed anesthesia with the patient or authorized representative who has indicated his/her understanding and acceptance.     Dental Advisory Given  Plan Discussed with: CRNA  Anesthesia Plan Comments:         Anesthesia Quick Evaluation

## 2022-06-16 NOTE — Transfer of Care (Signed)
Immediate Anesthesia Transfer of Care Note  Patient: Michele Hood  Procedure(s) Performed: COLONOSCOPY WITH PROPOFOL  Patient Location: PACU  Anesthesia Type: General  Level of Consciousness: awake, alert  and patient cooperative  Airway and Oxygen Therapy: Patient Spontanous Breathing and Patient connected to supplemental oxygen  Post-op Assessment: Post-op Vital signs reviewed, Patient's Cardiovascular Status Stable, Respiratory Function Stable, Patent Airway and No signs of Nausea or vomiting  Post-op Vital Signs: Reviewed and stable  Complications: There were no known notable events for this encounter.

## 2022-06-16 NOTE — H&P (Signed)
   Lucilla Lame, MD [facility] [address], [place], [place] [address] Phone: [phone] Fax : [phone]  Primary Care Physician:  Elgie Collard, MD Primary Gastroenterologist:  Dr. Allen Norris  Pre-Procedure History & Physical: HPI:  Michele Hood is a 56 y.o. female is here for a screening colonoscopy.   History reviewed. No pertinent past medical history.  Past Surgical History:  Procedure Laterality Date   BREAST BIOPSY Left 1990's   cyst removed- neg   COLONOSCOPY WITH PROPOFOL N/A 02/29/2016   Procedure: COLONOSCOPY WITH PROPOFOL;  Surgeon: Lucilla Lame, MD;  Location: [place];  Service: Endoscopy;  Laterality: N/A;    Prior to Admission medications   Medication Sig Start Date End Date Taking? Authorizing Provider  COVID-19 mRNA Vac-TriS, Pfizer, (PFIZER-BIONT COVID-19 VAC-TRIS) SUSP injection Inject into the muscle. 04/23/21   Carlyle Basques, MD  hydroquinone 4 % cream Apply a thin layer twice daily to affected areas for no more than 3 months Patient not taking: Reported on 06/12/2022 11/12/21     Vitamin D, Ergocalciferol, (DRISDOL) 1.25 MG (50000 UNIT) CAPS capsule Take 1 capsule by mouth once a week Patient not taking: Reported on 06/12/2022 08/07/21       Allergies as of 04/24/2022   (No Known Allergies)    Family History  Problem Relation Age of Onset   Lung cancer Mother    Breast cancer Neg Hx     Social History   Socioeconomic History   Marital status: Legally Separated    Spouse name: Not on file   Number of children: Not on file   Years of education: Not on file   Highest education level: Not on file  Occupational History   Not on file  Tobacco Use   Smoking status: Never   Smokeless tobacco: Never  Vaping Use   Vaping Use: Never used  Substance and Sexual Activity   Alcohol use: Yes    Comment: Occas   Drug use: Not on file   Sexual activity: Not on file  Other Topics Concern   Not on file  Social History Narrative    Not on file   Social Determinants of Health   Financial Resource Strain: Not on file  Food Insecurity: Not on file  Transportation Needs: Not on file  Physical Activity: Not on file  Stress: Not on file  Social Connections: Not on file  Intimate Partner Violence: Not on file    Review of Systems: See HPI, otherwise negative ROS  Physical Exam: BP (!) 140/84   Pulse 87   Temp 97.8 F (36.6 C) (Temporal)   Resp 16   Ht 5' 3.5" (1.613 m)   Wt 102.1 kg   LMP 10/31/2015   SpO2 97%   BMI 39.23 kg/m  General:   Alert,  pleasant and cooperative in NAD Head:  Normocephalic and atraumatic. Neck:  Supple; no masses or thyromegaly. Lungs:  Clear throughout to auscultation.    Heart:  Regular rate and rhythm. Abdomen:  Soft, nontender and nondistended. Normal bowel sounds, without guarding, and without rebound.   Neurologic:  Alert and  oriented x4;  grossly normal neurologically.  Impression/Plan: Michele Hood is now here to undergo a screening colonoscopy.  Risks, benefits, and alternatives regarding colonoscopy have been reviewed with the patient.  Questions have been answered.  All parties agreeable.

## 2022-06-16 NOTE — Op Note (Signed)
[facility] Gastroenterology Patient Name: Michele Hood Procedure Date: 06/16/2022 7:55 AM MRN: [id number] Account #: [id number] Date of Birth: 1966-02-07 Admit Type: Outpatient Age: 56 Room: [facility] OR ROOM 01 Gender: Female Note Status: Finalized Instrument Name: [id number] Procedure:             Colonoscopy Indications:           Screening for colorectal malignant neoplasm Providers:             Lucilla Lame MD, MD Referring MD:          Shelby Mattocks. Georga Bora, MD (Referring MD) Medicines:             Propofol per Anesthesia Complications:         No immediate complications. Procedure:             Pre-Anesthesia Assessment:                        - Prior to the procedure, a History and Physical was                         performed, and patient medications and allergies were                         reviewed. The patient's tolerance of previous                         anesthesia was also reviewed. The risks and benefits                         of the procedure and the sedation options and risks                         were discussed with the patient. All questions were                         answered, and informed consent was obtained. Prior                         Anticoagulants: The patient has taken no previous                         anticoagulant or antiplatelet agents. ASA Grade                         Assessment: II - A patient with mild systemic disease.                         After reviewing the risks and benefits, the patient                         was deemed in satisfactory condition to undergo the                         procedure.                        After obtaining informed consent, the colonoscope was  passed under direct vision. Throughout the procedure,                         the patient's blood pressure, pulse, and oxygen                         saturations were monitored continuously. The                          Colonoscope was introduced through the anus and                         advanced to the the cecum, identified by appendiceal                         orifice and ileocecal valve. The colonoscopy was                         performed without difficulty. The patient tolerated                         the procedure well. The quality of the bowel                         preparation was excellent. Findings:      The perianal and digital rectal examinations were normal.      Non-bleeding internal hemorrhoids were found during retroflexion. The       hemorrhoids were Grade I (internal hemorrhoids that do not prolapse). Impression:            - Non-bleeding internal hemorrhoids.                        - No specimens collected. Recommendation:        - Discharge patient to home.                        - Resume previous diet.                        - Continue present medications.                        - Repeat colonoscopy in 7 years for surveillance. Procedure Code(s):     --- Professional ---                        [id number], Colonoscopy, flexible; diagnostic, including                         collection of specimen(s) by brushing or washing, when                         performed (separate procedure) Diagnosis Code(s):     --- Professional ---                        Z12.11, Encounter for screening for malignant neoplasm                         of colon CPT copyright 2019 American  Medical Association. All rights reserved. The codes documented in this report are preliminary and upon coder review may  be revised to meet current compliance requirements. Lucilla Lame MD, MD 06/16/2022 8:13:53 AM This report has been signed electronically. Number of Addenda: 0 Note Initiated On: 06/16/2022 7:55 AM Scope Withdrawal Time: 0 hours 7 minutes 23 seconds  Total Procedure Duration: 0 hours 10 minutes 14 seconds  Estimated Blood Loss:  Estimated blood loss: none.      [facility]

## 2022-06-16 NOTE — Anesthesia Postprocedure Evaluation (Signed)
Anesthesia Post Note  Patient: Michele Hood  Procedure(s) Performed: COLONOSCOPY WITH PROPOFOL     Patient location during evaluation: PACU Anesthesia Type: General Level of consciousness: awake and alert Pain management: pain level controlled Vital Signs Assessment: post-procedure vital signs reviewed and stable Respiratory status: spontaneous breathing, nonlabored ventilation, respiratory function stable and patient connected to nasal cannula oxygen Cardiovascular status: blood pressure returned to baseline and stable Postop Assessment: no apparent nausea or vomiting Anesthetic complications: no   There were no known notable events for this encounter.  April Manson

## 2022-06-17 ENCOUNTER — Encounter: Payer: Self-pay | Admitting: Gastroenterology

## 2022-08-14 ENCOUNTER — Other Ambulatory Visit: Payer: Self-pay

## 2022-08-14 MED ORDER — FLUTICASONE PROPIONATE 50 MCG/ACT NA SUSP
1.0000 | Freq: Every day | NASAL | 1 refills | Status: AC
  Filled 2022-08-14: qty 16, 30d supply, fill #0

## 2022-08-14 MED ORDER — BENZONATATE 200 MG PO CAPS
200.0000 mg | ORAL_CAPSULE | Freq: Two times a day (BID) | ORAL | 0 refills | Status: DC
  Filled 2022-08-14: qty 20, 10d supply, fill #0

## 2022-08-14 MED ORDER — AZITHROMYCIN 250 MG PO TABS
ORAL_TABLET | ORAL | 0 refills | Status: AC
  Filled 2022-08-14: qty 6, 5d supply, fill #0

## 2022-12-25 ENCOUNTER — Other Ambulatory Visit: Payer: Self-pay

## 2022-12-25 DIAGNOSIS — Z6841 Body Mass Index (BMI) 40.0 and over, adult: Secondary | ICD-10-CM | POA: Diagnosis not present

## 2022-12-25 DIAGNOSIS — E669 Obesity, unspecified: Secondary | ICD-10-CM | POA: Diagnosis not present

## 2022-12-25 MED ORDER — WEGOVY 0.25 MG/0.5ML ~~LOC~~ SOAJ
0.2500 mg | SUBCUTANEOUS | 1 refills | Status: DC
  Filled 2022-12-25: qty 2, 28d supply, fill #0

## 2022-12-25 MED ORDER — PHENTERMINE HCL 37.5 MG PO CAPS
37.5000 mg | ORAL_CAPSULE | Freq: Every morning | ORAL | 1 refills | Status: DC
  Filled 2022-12-25: qty 30, 30d supply, fill #0

## 2022-12-26 ENCOUNTER — Other Ambulatory Visit: Payer: Self-pay

## 2022-12-30 ENCOUNTER — Other Ambulatory Visit: Payer: Self-pay

## 2023-02-16 ENCOUNTER — Ambulatory Visit: Payer: Commercial Managed Care - PPO

## 2023-02-17 ENCOUNTER — Ambulatory Visit
Admission: RE | Admit: 2023-02-17 | Discharge: 2023-02-17 | Disposition: A | Discharge status: Home / Self Care | Payer: 59 | Source: Ambulatory Visit | Attending: Obstetrics and Gynecology | Admitting: Obstetrics and Gynecology

## 2023-02-17 DIAGNOSIS — Z1231 Encounter for screening mammogram for malignant neoplasm of breast: Secondary | ICD-10-CM | POA: Diagnosis not present

## 2023-04-23 DIAGNOSIS — Z01419 Encounter for gynecological examination (general) (routine) without abnormal findings: Secondary | ICD-10-CM | POA: Diagnosis not present

## 2023-04-24 DIAGNOSIS — Z1329 Encounter for screening for other suspected endocrine disorder: Secondary | ICD-10-CM | POA: Diagnosis not present

## 2023-04-24 DIAGNOSIS — Z131 Encounter for screening for diabetes mellitus: Secondary | ICD-10-CM | POA: Diagnosis not present

## 2023-04-24 DIAGNOSIS — E559 Vitamin D deficiency, unspecified: Secondary | ICD-10-CM | POA: Diagnosis not present

## 2023-04-24 DIAGNOSIS — Z1321 Encounter for screening for nutritional disorder: Secondary | ICD-10-CM | POA: Diagnosis not present

## 2023-04-24 DIAGNOSIS — Z13220 Encounter for screening for lipoid disorders: Secondary | ICD-10-CM | POA: Diagnosis not present

## 2023-04-24 DIAGNOSIS — Z13228 Encounter for screening for other metabolic disorders: Secondary | ICD-10-CM | POA: Diagnosis not present

## 2023-05-19 ENCOUNTER — Other Ambulatory Visit: Payer: Self-pay

## 2023-05-19 MED ORDER — VITAMIN D (ERGOCALCIFEROL) 1.25 MG (50000 UNIT) PO CAPS
50000.0000 [IU] | ORAL_CAPSULE | ORAL | 3 refills | Status: DC
  Filled 2023-05-19 – 2023-11-06 (×2): qty 12, 84d supply, fill #0
  Filled 2023-12-23 – 2024-01-13 (×2): qty 12, 84d supply, fill #1

## 2023-05-19 MED ORDER — ATORVASTATIN CALCIUM 20 MG PO TABS
20.0000 mg | ORAL_TABLET | Freq: Every day | ORAL | 12 refills | Status: AC
  Filled 2023-05-19: qty 30, 30d supply, fill #0

## 2023-06-05 ENCOUNTER — Other Ambulatory Visit: Payer: Self-pay

## 2023-06-15 DIAGNOSIS — Z801 Family history of malignant neoplasm of trachea, bronchus and lung: Secondary | ICD-10-CM | POA: Diagnosis not present

## 2023-06-15 DIAGNOSIS — R059 Cough, unspecified: Secondary | ICD-10-CM | POA: Diagnosis not present

## 2023-06-26 ENCOUNTER — Other Ambulatory Visit: Payer: Self-pay

## 2023-06-26 MED ORDER — FLULAVAL 0.5 ML IM SUSY
0.5000 mL | PREFILLED_SYRINGE | Freq: Once | INTRAMUSCULAR | 0 refills | Status: AC
  Filled 2023-06-26: qty 0.5, 1d supply, fill #0

## 2023-07-24 ENCOUNTER — Other Ambulatory Visit: Payer: Self-pay

## 2023-11-06 ENCOUNTER — Other Ambulatory Visit: Payer: Self-pay

## 2023-12-04 DIAGNOSIS — H5203 Hypermetropia, bilateral: Secondary | ICD-10-CM | POA: Diagnosis not present

## 2023-12-04 DIAGNOSIS — H524 Presbyopia: Secondary | ICD-10-CM | POA: Diagnosis not present

## 2023-12-23 ENCOUNTER — Other Ambulatory Visit: Payer: Self-pay

## 2024-01-25 ENCOUNTER — Other Ambulatory Visit: Payer: Self-pay

## 2024-02-10 ENCOUNTER — Other Ambulatory Visit: Payer: Self-pay | Admitting: Obstetrics and Gynecology

## 2024-02-10 DIAGNOSIS — Z1231 Encounter for screening mammogram for malignant neoplasm of breast: Secondary | ICD-10-CM

## 2024-02-24 ENCOUNTER — Ambulatory Visit
Admission: RE | Admit: 2024-02-24 | Discharge: 2024-02-24 | Disposition: A | Discharge status: Home / Self Care | Source: Ambulatory Visit | Attending: Obstetrics and Gynecology | Admitting: Obstetrics and Gynecology

## 2024-02-24 DIAGNOSIS — Z1231 Encounter for screening mammogram for malignant neoplasm of breast: Secondary | ICD-10-CM | POA: Diagnosis not present

## 2024-03-28 ENCOUNTER — Other Ambulatory Visit: Payer: Self-pay

## 2024-04-20 DIAGNOSIS — Z01419 Encounter for gynecological examination (general) (routine) without abnormal findings: Secondary | ICD-10-CM | POA: Diagnosis not present

## 2024-04-20 DIAGNOSIS — Z124 Encounter for screening for malignant neoplasm of cervix: Secondary | ICD-10-CM | POA: Diagnosis not present

## 2024-04-21 ENCOUNTER — Other Ambulatory Visit: Payer: Self-pay

## 2024-04-21 MED ORDER — WEGOVY 0.25 MG/0.5ML ~~LOC~~ SOAJ: 0.2500 mg | SUBCUTANEOUS | 1 refills | Status: DC

## 2024-05-27 DIAGNOSIS — Z131 Encounter for screening for diabetes mellitus: Secondary | ICD-10-CM | POA: Diagnosis not present

## 2024-05-27 DIAGNOSIS — E782 Mixed hyperlipidemia: Secondary | ICD-10-CM | POA: Diagnosis not present

## 2024-05-27 DIAGNOSIS — E559 Vitamin D deficiency, unspecified: Secondary | ICD-10-CM | POA: Diagnosis not present

## 2024-05-27 DIAGNOSIS — Z13228 Encounter for screening for other metabolic disorders: Secondary | ICD-10-CM | POA: Diagnosis not present

## 2024-05-27 DIAGNOSIS — Z1321 Encounter for screening for nutritional disorder: Secondary | ICD-10-CM | POA: Diagnosis not present

## 2024-05-27 DIAGNOSIS — Z1329 Encounter for screening for other suspected endocrine disorder: Secondary | ICD-10-CM | POA: Diagnosis not present

## 2024-06-17 ENCOUNTER — Other Ambulatory Visit: Payer: Self-pay

## 2024-06-17 MED ORDER — FLUZONE 0.5 ML IM SUSY
0.5000 mL | PREFILLED_SYRINGE | Freq: Once | INTRAMUSCULAR | 0 refills | Status: AC
  Filled 2024-06-17: qty 0.5, 1d supply, fill #0

## 2024-06-22 ENCOUNTER — Other Ambulatory Visit: Payer: Self-pay

## 2024-06-22 MED ORDER — VITAMIN D (ERGOCALCIFEROL) 1.25 MG (50000 UNIT) PO CAPS
50000.0000 [IU] | ORAL_CAPSULE | ORAL | 3 refills | Status: AC
  Filled 2024-06-22 – 2024-07-14 (×2): qty 12, 84d supply, fill #0

## 2024-06-23 ENCOUNTER — Other Ambulatory Visit: Payer: Self-pay

## 2024-06-24 ENCOUNTER — Other Ambulatory Visit: Payer: Self-pay

## 2024-06-27 ENCOUNTER — Other Ambulatory Visit: Payer: Self-pay

## 2024-06-29 ENCOUNTER — Other Ambulatory Visit: Payer: Self-pay

## 2024-06-30 ENCOUNTER — Other Ambulatory Visit: Payer: Self-pay

## 2024-07-01 ENCOUNTER — Other Ambulatory Visit: Payer: Self-pay

## 2024-07-04 ENCOUNTER — Other Ambulatory Visit: Payer: Self-pay

## 2024-07-13 ENCOUNTER — Other Ambulatory Visit: Payer: Self-pay

## 2024-07-13 MED ORDER — ATORVASTATIN CALCIUM 20 MG PO TABS
20.0000 mg | ORAL_TABLET | Freq: Every day | ORAL | 3 refills | Status: AC
  Filled 2024-07-13: qty 90, 90d supply, fill #0

## 2024-07-14 ENCOUNTER — Other Ambulatory Visit: Payer: Self-pay

## 2024-07-20 ENCOUNTER — Encounter: Payer: Self-pay | Admitting: Family Medicine

## 2024-07-20 ENCOUNTER — Ambulatory Visit: Admitting: Family Medicine

## 2024-07-20 VITALS — BP 162/104 | HR 75 | Temp 98.5°F | Resp 18 | Ht 63.5 in | Wt 175.0 lb

## 2024-07-20 DIAGNOSIS — E7800 Pure hypercholesterolemia, unspecified: Secondary | ICD-10-CM | POA: Diagnosis not present

## 2024-07-20 DIAGNOSIS — Z23 Encounter for immunization: Secondary | ICD-10-CM

## 2024-07-21 NOTE — Progress Notes (Signed)
 New Patient Office Visit  Subjective    Patient ID: Michele Hood, female    DOB: [birth year]  Age: 58 y.o. MRN: [id number]  CC:  Chief Complaint  Patient presents with   Establish Care    HPI Michele Hood presents to establish care Discussed the use of AI scribe software for clinical note transcription with the patient, who gave verbal consent to proceed.  History of Present Illness   Michele Hood is a 58 year old female who presents for establishment of care with a new primary care provider.  She has a history of elevated cholesterol, first identified last year. Despite losing approximately 65 pounds over the past year with the aid of a GLP-1 medication ([facility] [place]), her cholesterol levels remained unchanged. She recently resumed taking her prescribed cholesterol medication, atorvastatin  20mg , three days ago.  She has a history of using Flonase  once for allergies.  She previously took phentermine  for four weeks in March 2024, which she discontinued due to adverse effects. She also takes vitamin D , 50,000 units once a week.  She has undergone two colonoscopies, the most recent being three years ago (06/16/2022), with no findings. She has received her flu vaccine a month ago and shingles vaccines in March 2022 and November 2021.  Socially, she lives alone, is separated from her husband of 25 years, and has been dating someone for the past five to six years. She has two daughters, both pursuing careers in nursing. One daughter is starting the NP program.  She does not smoke, drink alcohol, or use drugs. For leisure, she enjoys going to the [employer] and playing golf with her daughters.   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  Her father has had a CVA and lives with a daughter that has MS.  They are in [place] and she goes there every other week to cook and help out.         Outpatient Encounter  Medications as of 07/20/2024  Medication Sig   atorvastatin  (LIPITOR) 20 MG tablet Take 1 tablet (20 mg total) by mouth daily.   Vitamin D , Ergocalciferol , (DRISDOL ) 1.25 MG (50000 UNIT) CAPS capsule Take 1 capsule (50,000 Units total) by mouth once a week.   atorvastatin  (LIPITOR) 20 MG tablet Take 1 tablet (20 mg total) by mouth daily.   azithromycin  (ZITHROMAX  Z-PAK) 250 MG tablet 2 po today then one po q day   benzonatate  (TESSALON ) 200 MG capsule Take 1 capsule (200 mg total) by mouth 2 (two) times daily.   COVID-19 mRNA Vac-TriS, Pfizer, (PFIZER-BIONT COVID-19 VAC-TRIS) SUSP injection Inject into the muscle.   fluticasone  (FLONASE ) 50 MCG/ACT nasal spray Place 1 spray into both nostrils daily.   hydroquinone  4 % cream Apply a thin layer twice daily to affected areas for no more than 3 months (Patient not taking: Reported on 06/12/2022)   phentermine  37.5 MG capsule Take 1 capsule (37.5 mg total) by mouth every morning.   Semaglutide -Weight Management (WEGOVY ) 0.25 MG/0.5ML SOAJ Inject 0.25 mg into the skin once a week.   Semaglutide -Weight Management (WEGOVY ) 0.25 MG/0.5ML SOAJ Inject 0.25 mg into the skin once a week.   Vitamin D , Ergocalciferol , (DRISDOL ) 1.25 MG (50000 UNIT) CAPS capsule Take 1 capsule by mouth once a week (Patient not taking: Reported on 06/12/2022)   No facility-administered encounter medications on file as of 07/20/2024.    History reviewed. No pertinent past  medical history.  Past Surgical History:  Procedure Laterality Date   BREAST BIOPSY Left 1990's   cyst removed- neg   COLONOSCOPY WITH PROPOFOL  N/A 02/29/2016   Procedure: COLONOSCOPY WITH PROPOFOL ;  Surgeon: Rogelia Copping, MD;  Location: [facility];  Service: Endoscopy;  Laterality: N/A;   COLONOSCOPY WITH PROPOFOL  N/A 06/16/2022   Procedure: COLONOSCOPY WITH PROPOFOL ;  Surgeon: Copping Rogelia, MD;  Location: [facility];  Service: Endoscopy;  Laterality: N/A;    Family History  Problem  Relation Age of Onset   Lung cancer Mother    Cancer Mother    Kidney disease Father    Stroke Father    Cancer Maternal Grandmother    Breast cancer Neg Hx     Social History   Socioeconomic History   Marital status: Legally Separated    Spouse name: Not on file   Number of children: Not on file   Years of education: Not on file   Highest education level: Master's degree (e.g., MA, MS, MEng, MEd, MSW, MBA)  Occupational History   Not on file  Tobacco Use   Smoking status: Never   Smokeless tobacco: Never  Vaping Use   Vaping status: Never Used  Substance and Sexual Activity   Alcohol use: Not Currently    Comment: Occas   Drug use: Never   Sexual activity: Yes    Birth control/protection: None  Other Topics Concern   Not on file  Social History Narrative   Not on file   Social Drivers of Health   Financial Resource Strain: Low Risk  (07/20/2024)   Overall Financial Resource Strain (CARDIA)    Difficulty of Paying Living Expenses: Not hard at all  Food Insecurity: No Food Insecurity (07/20/2024)   Hunger Vital Sign    Worried About Running Out of Food in the Last Year: Never true    Ran Out of Food in the Last Year: Never true  Transportation Needs: No Transportation Needs (07/20/2024)   PRAPARE - Administrator, Civil Service (Medical): No    Lack of Transportation (Non-Medical): No  Physical Activity: Insufficiently Active (07/20/2024)   Exercise Vital Sign    Days of Exercise per Week: 3 days    Minutes of Exercise per Session: 40 min  Stress: No Stress Concern Present (07/20/2024)   [employer] of Occupational Health - Occupational Stress Questionnaire    Feeling of Stress: Not at all  Social Connections: Moderately Integrated (07/20/2024)   Social Connection and Isolation Panel    Frequency of Communication with Friends and Family: More than three times a week    Frequency of Social Gatherings with Friends and Family: Once a week     Attends Religious Services: More than 4 times per year    Active Member of [employer] or Organizations: Yes    Attends Banker Meetings: 1 to 4 times per year    Marital Status: Separated  Intimate Partner Violence: Not on file         Objective   BP (!) 162/104 (BP Location: Left Arm, Patient Position: Sitting, Cuff Size: Normal)   Pulse 75   Temp 98.5 F (36.9 C) (Oral)   Resp 18   Ht 5' 3.5 (1.613 m)   Wt 175 lb (79.4 kg)   LMP 10/31/2015   SpO2 97%   BMI 30.51 kg/m    Physical Exam Vitals and nursing note reviewed.  Constitutional:      Appearance: Normal appearance.  HENT:     Head: Normocephalic and atraumatic.  Eyes:     Conjunctiva/sclera: Conjunctivae normal.  Cardiovascular:     Rate and Rhythm: Normal rate and regular rhythm.  Pulmonary:     Effort: Pulmonary effort is normal.     Breath sounds: Normal breath sounds.  Musculoskeletal:     Right lower leg: No edema.     Left lower leg: No edema.  Skin:    General: Skin is warm and dry.  Neurological:     Mental Status: She is alert and oriented to person, place, and time.  Psychiatric:        Mood and Affect: Mood normal.        Behavior: Behavior normal.        Thought Content: Thought content normal.        Judgment: Judgment normal.            The ASCVD Risk score (Arnett DK, et al., 2019) failed to calculate for the following reasons:   Cannot find a previous HDL lab   Cannot find a previous total cholesterol lab     Assessment & Plan:  Immunization due -     Varicella-zoster vaccine IM  Assessment and Plan    Hypercholesterolemia Hypercholesterolemia persists despite significant weight loss of approximately 65 pounds over the past year. She has recently started taking atorvastatin  20mg  three days ago, but has been taking it at night, which may cause insomnia. There is no mention of a calculated ten-year risk of heart attack or stroke by her previous provider. - Advise  taking cholesterol medication in the morning to avoid insomnia. - Calculate ten-year risk of heart attack or stroke.  General Health Maintenance She has received a flu vaccine a month ago and has completed the shingles vaccine series. She has undergone two colonoscopies, with the last one being three years ago, and no findings were reported. The recommended follow-up interval for colonoscopy is unclear, but typically ten years if no findings are present. - Verify recommended follow-up interval for colonoscopy.        Return in about 3 months (around 10/20/2024), or if symptoms worsen or fail to improve.   Michele Espana K Tharon Kitch, MD

## 2024-08-07 ENCOUNTER — Telehealth: Payer: Self-pay | Admitting: Family Medicine

## 2024-08-07 ENCOUNTER — Other Ambulatory Visit: Payer: Self-pay | Admitting: Family Medicine

## 2024-08-07 MED ORDER — TIRZEPATIDE-WEIGHT MANAGEMENT 2.5 MG/0.5ML ~~LOC~~ SOLN: 2.5000 mg | SUBCUTANEOUS | 0 refills | Status: DC

## 2024-08-07 NOTE — Telephone Encounter (Signed)
 Had left and note that she wanted to start the [employer] [facility] program for a GLP-1.  Advised I will send a prescription for Zepbound 2.5 mg so she can get started.  She had any comments or concerns please let me know.

## 2024-09-01 ENCOUNTER — Other Ambulatory Visit: Payer: Self-pay | Admitting: Family Medicine

## 2024-09-01 MED ORDER — TIRZEPATIDE-WEIGHT MANAGEMENT 5 MG/0.5ML ~~LOC~~ SOLN: 5.0000 mg | SUBCUTANEOUS | 1 refills | Status: DC

## 2024-10-03 ENCOUNTER — Ambulatory Visit: Admitting: Family Medicine

## 2024-10-20 ENCOUNTER — Ambulatory Visit: Admitting: Family Medicine

## 2024-10-27 ENCOUNTER — Other Ambulatory Visit: Payer: Self-pay | Admitting: Family Medicine

## 2024-10-27 ENCOUNTER — Telehealth: Payer: Self-pay

## 2024-10-27 ENCOUNTER — Telehealth: Payer: Self-pay | Admitting: Family Medicine

## 2024-10-27 MED ORDER — TIRZEPATIDE 7.5 MG/0.5ML ~~LOC~~ SOAJ: 7.5000 mg | SUBCUTANEOUS | 0 refills | Status: AC

## 2024-10-27 NOTE — Telephone Encounter (Signed)
 Spoke with patient, she is on Zepbound  5 mg weekly.  She is not losing weight and she is not having any significant side effects.  She denies nausea or vomiting and constipation.  She would like to go up to the 7.5 mg dose.

## 2024-10-27 NOTE — Telephone Encounter (Signed)
 Sent Mychart to let patient know that I have reached out to alert PA team of this so they can try to start the auth process.    Copied from CRM #[id number]. Topic: Clinical - Prescription Issue >> Oct 27, 2024  3:05 PM Michele Hood wrote: Reason for CRM: Pt called in because received notificaiton form pharmacy that theree was an error with the prescription - tirzepatide  (MOUNJARO ) 7.5 MG/0.5ML Pen  Needing a form? Might be a PA... Please follow up with [facility] [place], [place] - [phone], [place] [id number] Phone: [phone]  Fax: [phone]

## 2024-10-28 ENCOUNTER — Other Ambulatory Visit (HOSPITAL_COMMUNITY): Payer: Self-pay

## 2024-10-28 ENCOUNTER — Telehealth: Payer: Self-pay

## 2024-10-28 NOTE — Telephone Encounter (Signed)
 Sent message to PA team to check on authorization.

## 2024-11-01 ENCOUNTER — Ambulatory Visit: Admitting: Family Medicine

## 2024-11-04 ENCOUNTER — Encounter: Payer: Self-pay | Admitting: Family Medicine

## 2024-11-04 ENCOUNTER — Ambulatory Visit: Admitting: Family Medicine

## 2024-11-04 VITALS — BP 147/93 | HR 80 | Ht 63.0 in | Wt 169.5 lb

## 2024-11-04 DIAGNOSIS — E559 Vitamin D deficiency, unspecified: Secondary | ICD-10-CM | POA: Insufficient documentation

## 2024-11-04 DIAGNOSIS — Z0000 Encounter for general adult medical examination without abnormal findings: Secondary | ICD-10-CM

## 2024-11-04 DIAGNOSIS — R5383 Other fatigue: Secondary | ICD-10-CM

## 2024-11-04 DIAGNOSIS — K5901 Slow transit constipation: Secondary | ICD-10-CM | POA: Insufficient documentation

## 2024-11-04 DIAGNOSIS — E782 Mixed hyperlipidemia: Secondary | ICD-10-CM | POA: Insufficient documentation

## 2024-11-04 MED ORDER — TIRZEPATIDE-WEIGHT MANAGEMENT 7.5 MG/0.5ML ~~LOC~~ SOLN: 7.5000 mg | SUBCUTANEOUS | 2 refills | Status: AC

## 2024-11-04 NOTE — Assessment & Plan Note (Signed)
 Try Senokot daily or fewer days a week but monitor for side effects such as cramps.  Consider switching to Colace if Senokot is not tolerated.

## 2024-11-04 NOTE — Progress Notes (Signed)
 "  Established Patient Office Visit  Subjective   Patient ID: Michele Hood, female    DOB: 31-Jul-1966  Age: 59 y.o. MRN: [id number]  Chief Complaint  Patient presents with   Follow-up    HPI Discussed the use of AI scribe software for clinical note transcription with the patient, who gave verbal consent to proceed.  History of Present Illness   Michele Hood is a 59 year old female who presents for medication management and weight loss follow-up.  She is currently on Zepbound  5.0mg  for weight management and has lost seven pounds.  She occasionally experiences nausea if she hasn't eaten, which resolves after eating. No significant side effects such as vomiting.  She experiences constipation and is currently using a tea from [employer], which she dislikes and uses infrequently. She has tried 'Smooth Moves' tea, which helps but she dislikes the taste.  She takes a weekly vitamin D  supplement, 50,000 IU once a week, and Lipitor.  She has her labs checked annually but is open to more frequent monitoring.  She has a history of regular mammograms, with the last one in April of the previous year. She has completed her shingles vaccination series. She has a family history of cervical cancer, but she has never had a positive HPV test and her last Pap smear was normal.  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 She has two daughters, one of whom is about to graduate and start an NP program, while the other works as a engineer, civil (consulting) at [employer].        Objective:     BP (!) 147/93   Pulse 80   Ht 5' 3 (1.6 m)   Wt 169 lb 8 oz (76.9 kg)   LMP 10/31/2015   SpO2 98%   BMI 30.03 kg/m    Physical Exam Vitals reviewed.  Constitutional:      Appearance: Normal appearance.  HENT:     Head: Normocephalic.  Eyes:     General:        Right eye: No discharge.        Left eye: No discharge.  Cardiovascular:     Rate and Rhythm: Normal rate.  Pulmonary:     Effort:  Pulmonary effort is normal.  Neurological:     Mental Status: She is alert and oriented to person, place, and time.  Psychiatric:        Mood and Affect: Mood normal.        Behavior: Behavior normal.        Thought Content: Thought content normal.        Judgment: Judgment normal.          No results found for any visits on 11/04/24.    The ASCVD Risk score (Arnett DK, et al., 2019) failed to calculate for the following reasons:   Cannot find a previous HDL lab   Cannot find a previous total cholesterol lab   * - Cholesterol units were assumed    Assessment & Plan:  Vitamin D  deficiency Assessment & Plan: She is taking vitamin D  50,000 international units weekly.  Will check her vitamin D  level  Orders: -     VITAMIN D  25 Hydroxy (Vit-D Deficiency, Fractures)  Mixed hyperlipidemia Assessment & Plan: She is on Lipitor.  Labs are typically checked annually.  Advised we will check today.  Orders: -     Comprehensive metabolic panel with GFR -  Lipid panel  Other fatigue -     CBC with Differential/Platelet -     TSH -     T4, free  Morbid obesity (HCC) Assessment & Plan: She has lost 7 pounds on Zepbound  5 mg weekly with no significant side effects.  She is interested in increasing the dose to 7.5 mg to enhance weight loss.  Discussed potential side effects at higher doses including increased nausea and greater constipation.  Emphasized the importance of maintaining adequate protein intake.  Increase Zepbound  to 7.5 mg weekly follow-up in 3 months but return sooner if needed  Orders: -     Tirzepatide -Weight Management; Inject 7.5 mg into the skin once a week.  Dispense: 2 mL; Refill: 2  Constipation by delayed colonic transit Assessment & Plan: Try Senokot daily or fewer days a week but monitor for side effects such as cramps.  Consider switching to Colace if Senokot is not tolerated.      Return in about 3 months (around 02/01/2025).    Caedan Sumler K Slayter Moorhouse,  MD "

## 2024-11-04 NOTE — Assessment & Plan Note (Signed)
 She is taking vitamin D  50,000 international units weekly.  Will check her vitamin D  level

## 2024-11-04 NOTE — Assessment & Plan Note (Signed)
 She is up-to-date on her mammogram which she gets in April.  She has had her shingles vaccines.  Last Pap was normal so she needs that every 5 years.

## 2024-11-04 NOTE — Assessment & Plan Note (Signed)
 She is on Lipitor.  Labs are typically checked annually.  Advised we will check today.

## 2024-11-04 NOTE — Assessment & Plan Note (Signed)
 She has lost 7 pounds on Zepbound  5 mg weekly with no significant side effects.  She is interested in increasing the dose to 7.5 mg to enhance weight loss.  Discussed potential side effects at higher doses including increased nausea and greater constipation.  Emphasized the importance of maintaining adequate protein intake.  Increase Zepbound  to 7.5 mg weekly follow-up in 3 months but return sooner if needed

## 2025-02-01 ENCOUNTER — Ambulatory Visit: Admitting: Family Medicine
